# Patient Record
Sex: Male | Born: 1950 | Race: White | Hispanic: No | Marital: Married | Smoking: Never smoker
Health system: Southern US, Community
[De-identification: ages and names within clinical notes are randomized; demographics above are authoritative.]

## PROBLEM LIST (undated history)

## (undated) DIAGNOSIS — E039 Hypothyroidism, unspecified: Secondary | ICD-10-CM

## (undated) DIAGNOSIS — T7840XA Allergy, unspecified, initial encounter: Secondary | ICD-10-CM

## (undated) HISTORY — PX: OTHER SURGICAL HISTORY: SHX169

## (undated) HISTORY — DX: Hypothyroidism, unspecified: E03.9

## (undated) HISTORY — DX: Allergy, unspecified, initial encounter: T78.40XA

## (undated) HISTORY — PX: TONSILLECTOMY AND ADENOIDECTOMY: SUR1326

## (undated) HISTORY — PX: KNEE SURGERY: SHX244

---

## 1998-07-28 ENCOUNTER — Ambulatory Visit (HOSPITAL_BASED_OUTPATIENT_CLINIC_OR_DEPARTMENT_OTHER): Admission: RE | Admit: 1998-07-28 | Discharge: 1998-07-28 | Payer: Self-pay | Admitting: *Deleted

## 2001-07-24 ENCOUNTER — Ambulatory Visit (HOSPITAL_COMMUNITY): Admission: RE | Admit: 2001-07-24 | Discharge: 2001-07-24 | Payer: Self-pay | Admitting: Gastroenterology

## 2009-09-06 ENCOUNTER — Emergency Department: Payer: Self-pay | Admitting: Emergency Medicine

## 2011-11-26 ENCOUNTER — Telehealth: Payer: Self-pay

## 2011-11-26 DIAGNOSIS — R7989 Other specified abnormal findings of blood chemistry: Secondary | ICD-10-CM

## 2011-11-26 NOTE — Telephone Encounter (Signed)
Pt would like to know when he is due for another colonoscopy.  Please call pt.  [phone]

## 2011-11-27 NOTE — Telephone Encounter (Signed)
Per [facility] note. Due for next colonoscopy in five years from 07/29/2006, so he is over due.  Ethan Lee

## 2011-11-27 NOTE — Telephone Encounter (Signed)
Chart pulled nurses station

## 2011-11-29 ENCOUNTER — Telehealth: Payer: Self-pay

## 2011-11-29 NOTE — Telephone Encounter (Signed)
Gave pt information about his colonoscopy and phone # to [facility]. He will call to schedule colonoscopy. Dr Perrin Maltese, pt stated you wanted him to have blood drawn for TSH around this time to re-check level and thinks you wanted lab only, not an OV. Can you put in an order for labs you want done, or do you also need to see pt?

## 2011-11-29 NOTE — Telephone Encounter (Signed)
DR MARTINS OFFICE CALLING TO GET PATIENTS LAST PE FAXED FOR AN UPCOMING APPOINTMENT  FAX IS [phone]

## 2011-12-04 NOTE — Telephone Encounter (Signed)
Need his chart, please

## 2011-12-06 NOTE — Telephone Encounter (Signed)
Call and tell pt. To return for repeat TSH anytime.  Order is placed.

## 2011-12-06 NOTE — Telephone Encounter (Signed)
Told pt that order is in for him to RTC for lab only. Pt agreed

## 2011-12-17 ENCOUNTER — Other Ambulatory Visit (INDEPENDENT_AMBULATORY_CARE_PROVIDER_SITE_OTHER): Payer: 59 | Admitting: Radiology

## 2011-12-17 DIAGNOSIS — R7989 Other specified abnormal findings of blood chemistry: Secondary | ICD-10-CM

## 2011-12-17 DIAGNOSIS — Z1329 Encounter for screening for other suspected endocrine disorder: Secondary | ICD-10-CM

## 2012-01-03 LAB — HM COLONOSCOPY

## 2012-04-06 ENCOUNTER — Ambulatory Visit (INDEPENDENT_AMBULATORY_CARE_PROVIDER_SITE_OTHER): Payer: 59 | Admitting: Physician Assistant

## 2012-04-06 VITALS — BP 120/86 | HR 62 | Temp 98.0°F | Resp 16 | Ht 74.5 in | Wt 197.4 lb

## 2012-04-06 DIAGNOSIS — S61209A Unspecified open wound of unspecified finger without damage to nail, initial encounter: Secondary | ICD-10-CM

## 2012-04-06 NOTE — Progress Notes (Signed)
  Subjective:    Patient ID: Ethan Lee, male    DOB: 16-Dec-1950, 61 y.o.   MRN: [id number]  HPI 61 yr old CM presents with a laceration on his left ring finger.  He cut it on a metal fan blade. Tdap was 04/2011.      Review of Systems  All other systems reviewed and are negative.       Objective:   Physical Exam  Nursing note and vitals reviewed. Constitutional: He appears well-developed and well-nourished.  Skin:         2% plain lidocaine injected locally 1.5cc.  Sterile P&D.  No deep structure.  Appx with 4.0 prolene.  #5 s.i. Placed. bandaged    Assessment & Plan:  Wound finger-SRX7days Wound care h/o given

## 2012-04-15 ENCOUNTER — Ambulatory Visit (INDEPENDENT_AMBULATORY_CARE_PROVIDER_SITE_OTHER): Payer: 59 | Admitting: Physician Assistant

## 2012-04-15 VITALS — BP 110/78 | HR 63 | Temp 97.6°F | Resp 16 | Ht 74.5 in | Wt 197.0 lb

## 2012-04-15 DIAGNOSIS — Z4802 Encounter for removal of sutures: Secondary | ICD-10-CM

## 2012-04-15 NOTE — Progress Notes (Signed)
   Patient ID: Ethan Lee MRN: [id number], DOB: 1951/03/29 61 y.o. Date of Encounter: 04/15/2012, 9:46 AM  Primary Physician: No primary provider on file.  Chief Complaint: Suture removal    See note from 04/06/12    HPI: 61 y.o. y/o male with injury to left ring finger. Here for suture removal s/p placement on 04/06/12 Doing well No issues/complaints Afebrile/ No chills No erythema No pain Able to move without difficulty Normal sensation  No past medical history on file.   Home Meds: Prior to Admission medications   Medication Sig Start Date End Date Taking? Authorizing Provider  cetirizine (ZYRTEC) 10 MG tablet Take 10 mg by mouth every evening.   Yes Historical Provider, MD  loratadine (CLARITIN) 10 MG tablet Take 10 mg by mouth every morning.   Yes Historical Provider, MD    Allergies: No Known Allergies  Physical Exam: Blood pressure 110/78, pulse 63, temperature 97.6 F (36.4 C), resp. rate 16, height 6' 2.5" (1.892 m), weight 197 lb (89.359 kg)., Body mass index is 24.96 kg/(m^2). General: Well developed, well nourished, in no acute distress. Head: Normocephalic, atraumatic, sclera non-icteric, no xanthomas, nares are without discharge.  Neck: Supple. Lungs: Breathing is unlabored. Heart: Normal rate. Msk:  Strength and tone appear normal for 61. Wound: Wound well healed without erythema, swelling, or tenderness to palpation. FROM and 5/5 strength with normal sensation throughout including 2 point discrimination Skin: See above, otherwise dry without rash or erythema. Extremities: No clubbing or cyanosis. No edema. Neuro: Alert and oriented X 3. Moves all extremities spontaneously.  Psych:  Responds to questions appropriately with a normal affect.   PROCEDURE: Verbal consent obtained. 5 sutures removed without difficulty.  Assessment and Plan: 61 y.o. y/o male here for suture removal for wound described above. -Sutures removed per above -Wound  resolved -RTC prn  Signed, Eula Listen, PA-C 04/15/2012 9:46 AM

## 2012-06-30 ENCOUNTER — Ambulatory Visit (INDEPENDENT_AMBULATORY_CARE_PROVIDER_SITE_OTHER): Payer: 59 | Admitting: Internal Medicine

## 2012-06-30 ENCOUNTER — Encounter: Payer: Self-pay | Admitting: Internal Medicine

## 2012-06-30 VITALS — BP 118/77 | HR 69 | Temp 98.1°F | Resp 16 | Ht 74.0 in | Wt 194.0 lb

## 2012-06-30 DIAGNOSIS — Z91010 Allergy to peanuts: Secondary | ICD-10-CM

## 2012-06-30 DIAGNOSIS — Z789 Other specified health status: Secondary | ICD-10-CM

## 2012-06-30 DIAGNOSIS — J301 Allergic rhinitis due to pollen: Secondary | ICD-10-CM

## 2012-06-30 DIAGNOSIS — R011 Cardiac murmur, unspecified: Secondary | ICD-10-CM

## 2012-06-30 DIAGNOSIS — L853 Xerosis cutis: Secondary | ICD-10-CM

## 2012-06-30 DIAGNOSIS — R012 Other cardiac sounds: Secondary | ICD-10-CM

## 2012-06-30 DIAGNOSIS — Z0000 Encounter for general adult medical examination without abnormal findings: Secondary | ICD-10-CM

## 2012-06-30 LAB — POCT UA - MICROSCOPIC ONLY
Casts, Ur, LPF, POC: NEGATIVE
Yeast, UA: NEGATIVE

## 2012-06-30 LAB — POCT URINALYSIS DIPSTICK
Blood, UA: NEGATIVE
Ketones, UA: NEGATIVE
Protein, UA: NEGATIVE
Spec Grav, UA: 1.015
Urobilinogen, UA: 0.2
pH, UA: 5.5

## 2012-06-30 LAB — IFOBT (OCCULT BLOOD): IFOBT: NEGATIVE

## 2012-06-30 MED ORDER — TRIAMCINOLONE ACETONIDE 0.1 % EX CREA: TOPICAL_CREAM | Freq: Two times a day (BID) | CUTANEOUS | Status: DC

## 2012-06-30 NOTE — Progress Notes (Signed)
  Subjective:    Patient ID: Ethan Lee, male    DOB: 1950/10/05, 61 y.o.   MRN: [id number]  HPI Feels good Has occ neck pain, loss of rom, no nms loss Allergys worst problem and seeing new allergist soon See scanned hx  Review of Systems See scanned ros    Objective:   Physical Exam  Vitals reviewed. Constitutional: He is oriented to person, place, and time. He appears well-developed and well-nourished.  HENT:  Right Ear: External ear normal.  Left Ear: External ear normal.  Nose: Nose normal.  Mouth/Throat: Oropharynx is clear and moist.  Eyes: EOM are normal. Pupils are equal, round, and reactive to light.  Neck: Normal range of motion. Neck supple. No tracheal deviation present. No thyromegaly present.  Cardiovascular: Normal rate, regular rhythm, S1 normal, S2 normal, intact distal pulses and normal pulses.   No extrasystoles are present. PMI is not displaced.        Has mid systolic click  Pulmonary/Chest: Effort normal and breath sounds normal.  Abdominal: Soft. Bowel sounds are normal. He exhibits no distension and no mass. There is no tenderness.  Genitourinary: Rectum normal, prostate normal and penis normal.  Musculoskeletal: Normal range of motion. He exhibits tenderness.  Lymphadenopathy:    He has no cervical adenopathy.  Neurological: He is alert and oriented to person, place, and time. He has normal reflexes. No cranial nerve deficit. He exhibits normal muscle tone. Coordination normal.  Skin: Skin is warm and dry.  Psychiatric: He has a normal mood and affect. His behavior is normal. Judgment and thought content normal.    ekg nl      Assessment & Plan:  RF to cardiology to evaluate mid systolic click Otherwise healthy exam

## 2012-06-30 NOTE — Patient Instructions (Addendum)
Food Allergy A food allergy occurs from eating something you are sensitive to. Food allergies occur in all age groups. It may be passed to you from your parents (heredity).  CAUSES  Some common causes are cow's milk, seafood, eggs, nuts (including peanut butter), wheat, and soybeans. SYMPTOMS  Common problems are:   Swelling around the mouth.   An itchy, red rash.   Hives.   Vomiting.   Diarrhea.  Severe allergic reactions are life-threatening. This reaction is called anaphylaxis. It can cause the mouth and throat to swell. This makes it hard to breathe and swallow. In severe reactions, only a small amount of food may be fatal within seconds. HOME CARE INSTRUCTIONS   If you are unsure what caused the reaction, keep a diary of foods eaten and symptoms that followed. Avoid foods that cause reactions.   If hives or rash are present:   Take medicines as directed.   Use an over-the-counter antihistamine (diphenhydramine) to treat hives and itching as needed.   Apply cold compresses to the skin or take baths in cool water. Avoid hot baths or showers. These will increase the redness and itching.   If you are severely allergic:   Hospitalization is often required following a severe reaction.   Wear a medical alert bracelet or necklace that describes the allergy.   Carry your anaphylaxis kit or epinephrine injection with you at all times. Both you and your family members should know how to use this. This can be lifesaving if you have a severe reaction. If epinephrine is used, it is important for you to seek immediate medical care or call your local emergency services (911 in [place] When the epinephrine wears off, it can be followed by a delayed reaction, which can be fatal.   Replace your epinephrine immediately after use in case of another reaction.   Ask your caregiver for instructions if you have not been taught how to use an epinephrine injection.   Do not drive until medicines  used to treat the reaction have worn off, unless approved by your caregiver.  SEEK MEDICAL CARE IF:   You suspect a food allergy. Symptoms generally happen within 30 minutes of eating a food.   Your symptoms have not gone away within 2 days. See your caregiver sooner if symptoms are getting worse.   You develop new symptoms.   You want to retest yourself with a food or drink you think causes an allergic reaction. Never do this if an anaphylactic reaction to that food or drink has happened before.   There is a return of the symptoms which brought you to your caregiver.  SEEK IMMEDIATE MEDICAL CARE IF:   You have trouble breathing, are wheezing, or you have a tight feeling in your chest or throat.   You have a swollen mouth, or you have hives, swelling, or itching all over your body. Use your epinephrine injection immediately. This is given into the outside of your thigh, deep into the muscle. Following use of the epinephrine injection, seek help right away.  Seek immediate medical care or call your local emergency services (911 in [place] MAKE SURE YOU:   Understand these instructions.   Will watch your condition.   Will get help right away if you are not doing well or get worse.  Document Released: 09/14/2000 Document Revised: 09/06/2011 Document Reviewed: 05/06/2008 [facility] Patient Information 2012 [place], [place].

## 2012-07-15 ENCOUNTER — Ambulatory Visit (INDEPENDENT_AMBULATORY_CARE_PROVIDER_SITE_OTHER): Payer: Self-pay | Admitting: Cardiology

## 2012-07-15 ENCOUNTER — Encounter: Payer: Self-pay | Admitting: Cardiology

## 2012-07-15 VITALS — BP 131/78 | HR 67 | Ht 74.0 in | Wt 197.0 lb

## 2012-07-15 DIAGNOSIS — R012 Other cardiac sounds: Secondary | ICD-10-CM

## 2012-07-15 DIAGNOSIS — I341 Nonrheumatic mitral (valve) prolapse: Secondary | ICD-10-CM

## 2012-07-15 DIAGNOSIS — I059 Rheumatic mitral valve disease, unspecified: Secondary | ICD-10-CM

## 2012-07-15 NOTE — Progress Notes (Signed)
HPI  The patient presents for evaluation of a mitral valve click.  He was seen by Dr. Perrin Maltese and he was heard to have a midsystolic click. The patient has had no past cardiac history. He is active walking his dog and pushing his lawnmower. With this he denies any chest pressure, neck or arm discomfort. He doesn't notice any palpitations, presyncope or syncope. He has no PND or orthopnea. He's had no weight gain or edema. He has had some mild fatigue recently.   Allergies  Allergen Reactions  . Peanuts (Peanut Oil)   . Shellfish Allergy     Current Outpatient Prescriptions  Medication Sig Dispense Refill  . aspirin 81 MG tablet Take 81 mg by mouth daily.      . cetirizine (ZYRTEC) 10 MG tablet Take 10 mg by mouth every evening.      . cholecalciferol (VITAMIN D) 1000 UNITS tablet Take 1,000 Units by mouth daily.      Marland Kitchen loratadine (CLARITIN) 10 MG tablet Take 10 mg by mouth every morning.      . triamcinolone cream (KENALOG) 0.1 % Apply topically 2 (two) times daily. Apply bid to chronic rash prn need Do not use on face or genitals  85.2 g  3  . vitamin C (ASCORBIC ACID) 500 MG tablet Take 500 mg by mouth daily. Two tabs      . vitamin E 400 UNIT capsule Take 400 Units by mouth daily.        Past Medical History  Diagnosis Date  . Allergy     Past Surgical History  Procedure Date  . Knee surgery     x 4  . Vocal cord polyps   . Tonsillectomy and adenoidectomy     No family history on file.  History   Social History  . Marital Status: Married    Spouse Name: N/A    Number of Children: 2  . Years of Education: N/A   Occupational History  .     Social History Main Topics  . Smoking status: Never Smoker   . Smokeless tobacco: Not on file  . Alcohol Use: Not on file  . Drug Use: Not on file  . Sexually Active: Not on file   Other Topics Concern  . Not on file   Social History Narrative   Lives with wife and works at [employer].    ROS:  As stated in the HPI and  negative for all other systems.  PHYSICAL EXAM BP 131/78  Pulse 67  Ht 6\' 2"  (1.88 m)  Wt 197 lb (89.359 kg)  BMI 25.29 kg/m2 GENERAL:  Well appearing HEENT:  Pupils equal round and reactive, fundi not visualized, oral mucosa unremarkable NECK:  No jugular venous distention, waveform within normal limits, carotid upstroke brisk and symmetric, no bruits, no thyromegaly LYMPHATICS:  No cervical, inguinal adenopathy LUNGS:  Clear to auscultation bilaterally BACK:  No CVA tenderness CHEST:  Unremarkable HEART:  PMI not displaced or sustained,S1 and S2 within normal limits, no S3, no S4, mid systolic click, no rubs, no murmurs ABD:  Flat, positive bowel sounds normal in frequency in pitch, no bruits, no rebound, no guarding, no midline pulsatile mass, no hepatomegaly, no splenomegaly EXT:  2 plus pulses throughout, no edema, no cyanosis no clubbing SKIN:  No rashes no nodules NEURO:  Cranial nerves II through XII grossly intact, motor grossly intact throughout PSYCH:  Cognitively intact, oriented to person place and time   EKG: Sinus  rhythm, rate 56, axis within normal limits, intervals within normal limits, no acute ST-T wave changes. 07/15/2012   ASSESSMENT AND PLAN  MVP - The patient has a click consistent with mitral valve prolapse but I do not hear a regurgitant murmur. At this point I will order an echocardiogram but I do not suspect that further cardiovascular treatment or testing beyond this will be needed at this time. We discussed the physiology using a heart model. I will see him back based on the results of this echocardiogram.

## 2012-07-15 NOTE — Patient Instructions (Addendum)
Continue current medications as listed.  Your physician has requested that you have an echocardiogram. Echocardiography is a painless test that uses sound waves to create images of your heart. It provides your doctor with information about the size and shape of your heart and how well your heart's chambers and valves are working. This procedure takes approximately one hour. There are no restrictions for this procedure.  Follow up will be based on these results.  Mitral Valve Prolapse The mitral valve is located between the top and bottom parts of the heart on the left side. A mitral valve prolapse (MVP) is an abnormal bulging of 1 or both of the 2 mitral leaflets. The valve bulges into the top chamber (atrium) of the heart when the bottom chamber (ventricle) squeezes or contracts. MVP is more common in females. It is an inherited problem and is usually not found until adolescence. It is not harmful and rarely needs other treatment. PROBLEMS MAY INCLUDE:  Chest pain.  Palpitations.  Anxiety.  Panic attacks.  Stroke, rarely. HOME CARE INSTRUCTIONS   Taking antibiotics before a dental or other medical procedure is no longer routine. Consult with your caregiver.  Exercise as your caregiver instructs.  Discuss cardiac risk factors associated with MVP with your caregiver. SEEK IMMEDIATE MEDICAL CARE IF:   You develop frequent episodes of chest pain or an irregular heartbeat.  You faint or pass out.  You have severe chest pain or shortness of breath.  You develop palpitations with weakness or dizziness.  You have difficulty with vision or swallowing or weakness or numbness on one side of your body. MAKE SURE YOU:   Understand these instructions.  Will watch your condition.  Will get help right away if you are not doing well or get worse. Document Released: 09/14/2000 Document Revised: 12/10/2011 Document Reviewed: 11/14/2007 [facility] Patient Information 2013 [place],  [place].

## 2012-07-23 ENCOUNTER — Ambulatory Visit (HOSPITAL_COMMUNITY): Payer: 59 | Attending: Cardiovascular Disease

## 2012-07-23 DIAGNOSIS — I341 Nonrheumatic mitral (valve) prolapse: Secondary | ICD-10-CM

## 2012-07-23 DIAGNOSIS — I369 Nonrheumatic tricuspid valve disorder, unspecified: Secondary | ICD-10-CM | POA: Insufficient documentation

## 2012-07-23 DIAGNOSIS — I080 Rheumatic disorders of both mitral and aortic valves: Secondary | ICD-10-CM | POA: Insufficient documentation

## 2012-07-23 DIAGNOSIS — R011 Cardiac murmur, unspecified: Secondary | ICD-10-CM | POA: Insufficient documentation

## 2012-07-23 DIAGNOSIS — I379 Nonrheumatic pulmonary valve disorder, unspecified: Secondary | ICD-10-CM | POA: Insufficient documentation

## 2012-07-23 NOTE — Progress Notes (Signed)
Echocardiogram performed.  

## 2012-07-25 ENCOUNTER — Telehealth: Payer: Self-pay | Admitting: Radiology

## 2012-07-25 NOTE — Telephone Encounter (Signed)
Spoke with pt and gave results - pt understood and will await copy in mail.   WGN:FAOZ in your box.

## 2012-11-15 ENCOUNTER — Other Ambulatory Visit: Payer: Self-pay

## 2013-01-05 ENCOUNTER — Encounter: Payer: Self-pay | Admitting: Internal Medicine

## 2013-05-01 DIAGNOSIS — Z0271 Encounter for disability determination: Secondary | ICD-10-CM

## 2013-05-21 ENCOUNTER — Telehealth: Payer: Self-pay | Admitting: Cardiology

## 2013-05-21 NOTE — Telephone Encounter (Signed)
New problem    Pt has question concerning the results of his Echocardiogram that was done on 07/23/12. Please call pt concerning this because it is causing him to get decline from insurance.

## 2013-05-22 NOTE — Telephone Encounter (Signed)
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  He would like to know if Dr Antoine Poche can write a letter to the insurance company for him stating that he should be able to obtain insurance despite this.  [address], [place] phone number.  Aware I will forward information to Dr Antoine Poche for review

## 2013-05-26 NOTE — Telephone Encounter (Signed)
Can we put together a note that states that his mild aortic dilatation is not clinically significant and needs no further work up and should not keep him from getting this insurance.  Call Mr. Rames with the results and send results to GUEST, Loretha Stapler, MD

## 2013-05-27 NOTE — Telephone Encounter (Signed)
New problem   Pt is calling in reference to problem he was having last week. Please call pt.

## 2013-05-27 NOTE — Telephone Encounter (Signed)
**Note De-Identified Ethan Lee Obfuscation** Pt states that his insurance company needs a reply by Friday 8/29. Please call pt if this is not possible.

## 2013-05-28 ENCOUNTER — Encounter: Payer: Self-pay | Admitting: *Deleted

## 2013-05-28 NOTE — Telephone Encounter (Signed)
Letter completed and faxed to [phone].  Pt aware.  Copy faxed to him as well at [phone]

## 2013-07-06 ENCOUNTER — Ambulatory Visit (INDEPENDENT_AMBULATORY_CARE_PROVIDER_SITE_OTHER): Payer: 59 | Admitting: Internal Medicine

## 2013-07-06 ENCOUNTER — Encounter: Payer: Self-pay | Admitting: Internal Medicine

## 2013-07-06 ENCOUNTER — Other Ambulatory Visit: Payer: Self-pay | Admitting: Internal Medicine

## 2013-07-06 VITALS — BP 118/78 | HR 59 | Temp 98.1°F | Resp 16 | Ht 73.25 in | Wt 197.0 lb

## 2013-07-06 DIAGNOSIS — B351 Tinea unguium: Secondary | ICD-10-CM

## 2013-07-06 DIAGNOSIS — I341 Nonrheumatic mitral (valve) prolapse: Secondary | ICD-10-CM

## 2013-07-06 DIAGNOSIS — R946 Abnormal results of thyroid function studies: Secondary | ICD-10-CM

## 2013-07-06 DIAGNOSIS — Z800 Family history of malignant neoplasm of digestive organs: Secondary | ICD-10-CM

## 2013-07-06 DIAGNOSIS — R319 Hematuria, unspecified: Secondary | ICD-10-CM

## 2013-07-06 DIAGNOSIS — Z0000 Encounter for general adult medical examination without abnormal findings: Secondary | ICD-10-CM

## 2013-07-06 DIAGNOSIS — Z23 Encounter for immunization: Secondary | ICD-10-CM

## 2013-07-06 DIAGNOSIS — I059 Rheumatic mitral valve disease, unspecified: Secondary | ICD-10-CM

## 2013-07-06 DIAGNOSIS — R7989 Other specified abnormal findings of blood chemistry: Secondary | ICD-10-CM

## 2013-07-06 DIAGNOSIS — N390 Urinary tract infection, site not specified: Secondary | ICD-10-CM

## 2013-07-06 LAB — POCT UA - MICROSCOPIC ONLY
Casts, Ur, LPF, POC: NEGATIVE
Yeast, UA: NEGATIVE

## 2013-07-06 LAB — POCT URINALYSIS DIPSTICK
Leukocytes, UA: NEGATIVE
Protein, UA: NEGATIVE
Spec Grav, UA: 1.01
Urobilinogen, UA: 0.2
pH, UA: 5.5

## 2013-07-06 MED ORDER — TERBINAFINE HCL 250 MG PO TABS: 250.0000 mg | ORAL_TABLET | Freq: Every day | ORAL | Status: DC

## 2013-07-06 NOTE — Patient Instructions (Addendum)
Allergic Rhinitis Allergic rhinitis is when the mucous membranes in the nose respond to allergens. Allergens are particles in the air that cause your body to have an allergic reaction. This causes you to release allergic antibodies. Through a chain of events, these eventually cause you to release histamine into the blood stream (hence the use of antihistamines). Although meant to be protective to the body, it is this release that causes your discomfort, such as frequent sneezing, congestion and an itchy runny nose.  CAUSES  The pollen allergens may come from grasses, trees, and weeds. This is seasonal allergic rhinitis, or "hay fever." Other allergens cause year-round allergic rhinitis (perennial allergic rhinitis) such as house dust mite allergen, pet dander and mold spores.  SYMPTOMS   Nasal stuffiness (congestion).  Runny, itchy nose with sneezing and tearing of the eyes.  There is often an itching of the mouth, eyes and ears. It cannot be cured, but it can be controlled with medications. DIAGNOSIS  If you are unable to determine the offending allergen, skin or blood testing may find it. TREATMENT   Avoid the allergen.  Medications and allergy shots (immunotherapy) can help.  Hay fever may often be treated with antihistamines in pill or nasal spray forms. Antihistamines block the effects of histamine. There are over-the-counter medicines that may help with nasal congestion and swelling around the eyes. Check with your caregiver before taking or giving this medicine. If the treatment above does not work, there are many new medications your caregiver can prescribe. Stronger medications may be used if initial measures are ineffective. Desensitizing injections can be used if medications and avoidance fails. Desensitization is when a patient is given ongoing shots until the body becomes less sensitive to the allergen. Make sure you follow up with your caregiver if problems continue. SEEK MEDICAL  CARE IF:   You develop fever (more than 100.5 F (38.1 C).  You develop a cough that does not stop easily (persistent).  You have shortness of breath.  You start wheezing.  Symptoms interfere with normal daily activities. Document Released: 06/12/2001 Document Revised: 12/10/2011 Document Reviewed: 12/22/2008 [facility] Patient Information 2014 [place], [place]. Ringworm, Nail A fungal infection of the nail (tinea unguium/onychomycosis) is common. It is common as the visible part of the nail is composed of dead cells which have no blood supply to help prevent infection. It occurs because fungi are everywhere and will pick any opportunity to grow on any dead material. Because nails are very slow growing they require up to 2 years of treatment with anti-fungal medications. The entire nail back to the base is infected. This includes approximately  of the nail which you cannot see. If your caregiver has prescribed a medication by mouth, take it every day and as directed. No progress will be seen for at least 6 to 9 months. Do not be disappointed! Because fungi live on dead cells with little or no exposure to blood supply, medication delivery to the infection is slow; thus the cure is slow. It is also why you can observe no progress in the first 6 months. The nail becoming cured is the base of the nail, as it has the blood supply. Topical medication such as creams and ointments are usually not effective. Important in successful treatment of nail fungus is closely following the medication regimen that your doctor prescribes. Sometimes you and your caregiver may elect to speed up this process by surgical removal of all the nails. Even this may still require 6 to  9 months of additional oral medications. See your caregiver as directed. Remember there will be no visible improvement for at least 6 months. See your caregiver sooner if other signs of infection (redness and swelling) develop. Document Released:  09/14/2000 Document Revised: 12/10/2011 Document Reviewed: 11/23/2008 [facility] Patient Information 2014 [place], [place].

## 2013-07-06 NOTE — Progress Notes (Signed)
  Subjective:    Patient ID: Ethan Lee, male    DOB: 12/09/1950, 62 y.o.   MRN: [id number]  HPI Doing well, allergys controlled, MVP stable. Hx elevated TSH, needs repeating.   Review of Systems  Constitutional: Negative.   HENT: Positive for neck pain and neck stiffness.   Eyes: Negative.   Respiratory: Negative.   Cardiovascular: Negative.   Gastrointestinal: Negative.   Endocrine: Negative.   Genitourinary: Negative.   Musculoskeletal: Positive for arthralgias.  Skin: Negative.   Allergic/Immunologic: Negative.   Neurological: Negative.   Hematological: Negative.   Psychiatric/Behavioral: Negative.        Objective:   Physical Exam  Constitutional: He is oriented to person, place, and time. He appears well-developed.  HENT:  Head: Normocephalic.  Right Ear: External ear normal.  Left Ear: External ear normal.  Nose: Nose normal.  Mouth/Throat: Oropharynx is clear and moist.  Eyes: Conjunctivae and EOM are normal. Pupils are equal, round, and reactive to light.  Neck: Normal range of motion. Neck supple. No tracheal deviation present. No thyromegaly present.  Cardiovascular: Normal rate, regular rhythm, normal heart sounds and intact distal pulses.   Pulmonary/Chest: Effort normal and breath sounds normal.  Abdominal: Soft. Bowel sounds are normal.  Genitourinary: Rectum normal, prostate normal and penis normal.  Musculoskeletal: Normal range of motion.  Lymphadenopathy:    He has no cervical adenopathy.  Neurological: He is alert and oriented to person, place, and time. He has normal reflexes. He displays normal reflexes. No cranial nerve deficit. He exhibits normal muscle tone. Coordination normal.  Skin: Skin is warm and dry.  Psychiatric: He has a normal mood and affect. His behavior is normal. Judgment and thought content normal.   Results for orders placed in visit on 06/30/12  POCT URINALYSIS DIPSTICK      Result Value Range   Color, UA yellow     Clarity, UA clear     Glucose, UA neg     Bilirubin, UA neg     Ketones, UA neg     Spec Grav, UA 1.015     Blood, UA neg     pH, UA 5.5     Protein, UA neg     Urobilinogen, UA 0.2     Nitrite, UA neg     Leukocytes, UA Negative    POCT UA - MICROSCOPIC ONLY      Result Value Range   WBC, Ur, HPF, POC neg     RBC, urine, microscopic neg     Bacteria, U Microscopic neg     Mucus, UA trace     Epithelial cells, urine per micros 0-1     Crystals, Ur, HPF, POC neg     Casts, Ur, LPF, POC neg     Yeast, UA neg    IFOBT (OCCULT BLOOD)      Result Value Range   IFOBT Negative      Culture toenail growth      Assessment & Plan:  Healthy CPE. Onychomycosis/Tinea pedis right foot only Lamisil 250mg  qd 3 months/SED?then off 2 mos then on 2 more months Check LFTs one month

## 2013-07-06 NOTE — Progress Notes (Signed)
  Subjective:    Patient ID: Ethan Lee, male    DOB: 1950/11/04, 62 y.o.   MRN: [id number]  HPI    Review of Systems  Constitutional: Negative.   HENT: Positive for neck pain.   Eyes: Negative.   Respiratory: Negative.   Cardiovascular: Negative.   Gastrointestinal: Negative.   Endocrine: Negative.   Genitourinary: Negative.   Skin: Negative.   Allergic/Immunologic: Negative.   Neurological: Negative.   Hematological: Negative.   Psychiatric/Behavioral: Negative.        Objective:   Physical Exam        Assessment & Plan:

## 2013-07-07 LAB — COMPREHENSIVE METABOLIC PANEL
AST: 19 IU/L (ref 0–40)
Albumin/Globulin Ratio: 2.1 (ref 1.1–2.5)
Albumin: 4.4 g/dL (ref 3.6–4.8)
Alkaline Phosphatase: 60 IU/L (ref 39–117)
BUN/Creatinine Ratio: 17 (ref 10–22)
BUN: 17 mg/dL (ref 8–27)
CO2: 26 mmol/L (ref 18–29)
Creatinine, Ser: 1.01 mg/dL (ref 0.76–1.27)
GFR calc Af Amer: 92 mL/min/{1.73_m2} (ref >59)
GFR calc non Af Amer: 79 mL/min/{1.73_m2} (ref >59)
Globulin, Total: 2.1 g/dL (ref 1.5–4.5)
Total Bilirubin: 0.7 mg/dL (ref 0.0–1.2)
Total Protein: 6.5 g/dL (ref 6.0–8.5)

## 2013-07-07 LAB — LIPID PANEL
Chol/HDL Ratio: 3.5 ratio units (ref 0.0–5.0)
Cholesterol, Total: 159 mg/dL (ref 100–199)
HDL: 46 mg/dL (ref >39)
LDL Calculated: 101 mg/dL — ABNORMAL HIGH (ref 0–99)
Triglycerides: 62 mg/dL (ref 0–149)
VLDL Cholesterol Cal: 12 mg/dL (ref 5–40)

## 2013-07-07 LAB — CBC WITH DIFFERENTIAL/PLATELET
Basophils Absolute: 0 10*3/uL (ref 0.0–0.2)
Eos: 5 %
HCT: 38.5 % (ref 37.5–51.0)
Immature Grans (Abs): 0 10*3/uL (ref 0.0–0.1)
Immature Granulocytes: 0 %
Lymphocytes Absolute: 0.9 10*3/uL (ref 0.7–3.1)
Lymphs: 27 %
MCHC: 35.6 g/dL (ref 31.5–35.7)
Monocytes: 11 %
RDW: 13.5 % (ref 12.3–15.4)
WBC: 3.4 10*3/uL (ref 3.4–10.8)

## 2013-07-07 LAB — TSH

## 2013-07-10 ENCOUNTER — Telehealth: Payer: Self-pay

## 2013-07-10 MED ORDER — SULFAMETHOXAZOLE-TMP DS 800-160 MG PO TABS: 1.0000 | ORAL_TABLET | Freq: Two times a day (BID) | ORAL | Status: DC

## 2013-07-10 NOTE — Telephone Encounter (Signed)
Dr. Perrin Maltese, disregard lab result comments. Spoke with Chelle because pt is going out of town tomorrow. Sent in Septra DS for pt.

## 2013-07-17 MED ORDER — CIPROFLOXACIN HCL 500 MG PO TABS: 500.0000 mg | ORAL_TABLET | Freq: Two times a day (BID) | ORAL | Status: DC

## 2013-07-17 NOTE — Addendum Note (Signed)
Addended by: Jonita Albee on: 07/17/2013 06:36 PM   Modules accepted: Orders

## 2013-07-18 NOTE — Addendum Note (Signed)
Addended by: Johnnette Litter on: 07/18/2013 09:15 AM   Modules accepted: Orders, Medications

## 2013-07-30 LAB — FUNGUS CULTURE, BLOOD

## 2013-08-06 ENCOUNTER — Other Ambulatory Visit: Payer: Self-pay

## 2013-08-25 ENCOUNTER — Encounter: Payer: Self-pay | Admitting: Internal Medicine

## 2013-09-02 ENCOUNTER — Telehealth: Payer: Self-pay

## 2013-09-02 NOTE — Telephone Encounter (Signed)
Patient is wondering whether he needs to come back in to do lab work? He says he took an antibacterial for UTI last visit and Dr. Perrin Maltese mentioned coming back in to check liver levels.

## 2013-09-03 NOTE — Telephone Encounter (Signed)
Dr. Guest, please advise 

## 2013-09-06 NOTE — Telephone Encounter (Signed)
rtc for lab work

## 2013-09-07 NOTE — Telephone Encounter (Signed)
Called to advise, left detailed message.

## 2013-09-08 ENCOUNTER — Ambulatory Visit (INDEPENDENT_AMBULATORY_CARE_PROVIDER_SITE_OTHER): Payer: 59 | Admitting: Internal Medicine

## 2013-09-08 VITALS — BP 115/82 | HR 70 | Temp 98.2°F | Resp 18 | Wt 199.0 lb

## 2013-09-08 DIAGNOSIS — Z79899 Other long term (current) drug therapy: Secondary | ICD-10-CM

## 2013-09-08 DIAGNOSIS — B351 Tinea unguium: Secondary | ICD-10-CM

## 2013-09-08 DIAGNOSIS — R7989 Other specified abnormal findings of blood chemistry: Secondary | ICD-10-CM

## 2013-09-08 DIAGNOSIS — R946 Abnormal results of thyroid function studies: Secondary | ICD-10-CM

## 2013-09-08 NOTE — Progress Notes (Signed)
   Subjective:    Patient ID: Ethan Lee, male    DOB: [birth year], 62 y.o.   MRN: [id number]  HPI Patient presents today for a follow-up on medication he was taking. He had a foot fungus and was on medication for a long period of time and he is wanting to make sure his liver function is not elevated.   Also has elevated TSH, needs f/up test  Review of Systems     Objective:   Physical Exam  Vitals reviewed. Constitutional: He is oriented to person, place, and time. He appears well-developed and well-nourished.  HENT:  Head: Normocephalic.  Eyes: EOM are normal. No scleral icterus.  Neck: Normal range of motion.  Pulmonary/Chest: Effort normal.  Musculoskeletal: Normal range of motion.  Neurological: He is alert and oriented to person, place, and time. He exhibits normal muscle tone. Coordination normal.          Assessment & Plan:  Liver profile/TSH Continue lamisil oral

## 2013-09-08 NOTE — Progress Notes (Signed)
   Subjective:    Patient ID: Ethan Lee, male    DOB: 1951/01/15, 62 y.o.   MRN: [id number]  HPI    Review of Systems     Objective:   Physical Exam        Assessment & Plan:

## 2013-09-09 LAB — HEPATIC FUNCTION PANEL
Albumin: 4.7 g/dL (ref 3.6–4.8)
Alkaline Phosphatase: 60 IU/L (ref 39–117)
Total Protein: 7.2 g/dL (ref 6.0–8.5)

## 2013-11-27 ENCOUNTER — Telehealth: Payer: Self-pay

## 2013-11-27 NOTE — Telephone Encounter (Signed)
Dr. Elder Cyphers   Patient is requesting an order for bloodwork.  He plans on rtc for an ov with you in June.  But wants to have his levels evaluated  [phone]

## 2014-03-30 ENCOUNTER — Telehealth: Payer: Self-pay

## 2014-03-30 NOTE — Telephone Encounter (Signed)
Pt.notified

## 2014-03-30 NOTE — Telephone Encounter (Signed)
PATIENT WAS PRESCRIBED MEDICATION FOR HIS TOENAIL INFECTION AND HE HAD SOME QUESTIONS ABOUT HOW TO TAKE IT.

## 2014-03-30 NOTE — Telephone Encounter (Signed)
Called and spoke with pt regarding his Terbinafine. He was originally prescribed it 07/06/13; was told to take it for 3 months 2 off then 2 on. He came for a follow up visit on 09/08/13 to have his LFT's checked.  He said he is out of refills and was requesting another refill.  I told him he may have to RTC to receive further refills. Should he come back in to have his LFT's checked again? He just finished taking 2 months of the medication.

## 2014-03-30 NOTE — Telephone Encounter (Signed)
So it sounds like he completed the course as Dr. Elder Cyphers directed, right?  Pt has completed 3 months, then off for 2 months, then just completed 2 more months -  Which is what Dr. Luiz Ochoa note from Oct 2014 indicates he should do.  If onychomycosis has not resolved, recommend he RTC for further evaluation

## 2014-04-21 ENCOUNTER — Ambulatory Visit (INDEPENDENT_AMBULATORY_CARE_PROVIDER_SITE_OTHER): Payer: 59 | Admitting: Family Medicine

## 2014-04-21 VITALS — BP 120/80 | HR 58 | Temp 98.1°F | Ht 73.0 in | Wt 201.0 lb

## 2014-04-21 DIAGNOSIS — Z79899 Other long term (current) drug therapy: Secondary | ICD-10-CM

## 2014-04-21 DIAGNOSIS — B351 Tinea unguium: Secondary | ICD-10-CM

## 2014-04-21 NOTE — Patient Instructions (Signed)
Followup with you over the phone concerning lab results and make any further recommendations concerning medication use for your toenail fungus going forward

## 2014-04-21 NOTE — Progress Notes (Signed)
   Subjective:    Patient ID: Ethan Lee, male    DOB: May 22, 1951, 63 y.o.   MRN: [id number]  HPI Patient's a 63 year old Caucasian male presents today to followup on his left great toe onychomycosis for long term treatment.  Patient typically takes medication for a month at a time taking approximately 2 weeks off in between her continuous dosing. Reports improvement in his toenail fungus and like to continue medications. He was told to check on his liver function testing every 6 months to a year to make sure that he does not develop hepatic toxicity from Lamisil. Patient denies any abdominal pain liver tenderness. Denies any headache fever nausea  Review of Systems Negative except for what is present in history of present illness    Objective:   Physical Exam Vitals reviewed.  Constitutional: He is oriented to person, place, and time. He appears well-developed and well-nourished.  HENT:  Head: Normocephalic.  Eyes: EOM are normal. No scleral icterus.  Neck: Normal range of motion.  Pulmonary/Chest: Effort normal.  Musculoskeletal: Normal range of motion.  Ext: Patient's right great toe has some thickening of the nail bed, yellowing consistent with likely a fungal infection  Neurological: He is alert and oriented to person, place, and time. He exhibits normal muscle tone. Coordination normal.     Assessment & Plan:  Should today for management of high-risk medication. Patient Lamisil for toenail fungus. Controlling symptoms for the most part. Patient is in pulse [place]. taking medication for a month at a time with 63 weeks off. He has not taken the medication in over a month. Check LFTs today determined no signs of hepatic toxicity. Followup with patient's labs over the phone. Patient will followup when necessary otherwise.

## 2014-04-22 ENCOUNTER — Encounter: Payer: Self-pay | Admitting: Family Medicine

## 2014-04-22 NOTE — Progress Notes (Signed)
Patient discussed with Dr. Ollen Barges. Agree with assessment and plan of care per her note. In summary - on high risk med - lamisil, but prior LFT's ok.  Tolerating medication without apparent SE's or rxn.  Repeat LFT's.

## 2014-04-23 LAB — HEPATIC FUNCTION PANEL
ALT: 12 IU/L (ref 0–44)
AST: 14 IU/L (ref 0–40)
Albumin: 4.9 g/dL — ABNORMAL HIGH (ref 3.6–4.8)
Alkaline Phosphatase: 64 IU/L (ref 39–117)
BILIRUBIN DIRECT: 0.13 mg/dL (ref 0.00–0.40)
Total Bilirubin: 0.6 mg/dL (ref 0.0–1.2)
Total Protein: 7.2 g/dL (ref 6.0–8.5)

## 2014-04-29 ENCOUNTER — Encounter: Payer: Self-pay | Admitting: Radiology

## 2014-05-06 ENCOUNTER — Telehealth: Payer: Self-pay

## 2014-05-06 DIAGNOSIS — B351 Tinea unguium: Secondary | ICD-10-CM

## 2014-05-06 NOTE — Telephone Encounter (Signed)
Pt would like a refill on terbinafine (LAMISIL) 250 MG tablet [[id number]]

## 2014-05-06 NOTE — Telephone Encounter (Signed)
Please advise dosing directions for Lamisil- lab results state to continue lamisil with pulse dosing.

## 2014-05-07 MED ORDER — TERBINAFINE HCL 250 MG PO TABS: 250.0000 mg | ORAL_TABLET | Freq: Every day | ORAL | Status: DC

## 2014-05-07 NOTE — Telephone Encounter (Signed)
i usually recommend this for 12 weeks then off.  Would repeat liver tests ever 6 weeks while on medication, though.

## 2014-05-08 NOTE — Telephone Encounter (Signed)
Pt.notified

## 2014-07-19 ENCOUNTER — Encounter: Payer: 59 | Admitting: Internal Medicine

## 2014-07-20 ENCOUNTER — Ambulatory Visit (INDEPENDENT_AMBULATORY_CARE_PROVIDER_SITE_OTHER): Payer: 59 | Admitting: Internal Medicine

## 2014-07-20 VITALS — BP 120/80 | HR 74 | Temp 98.2°F | Resp 16 | Ht 73.0 in | Wt 201.0 lb

## 2014-07-20 DIAGNOSIS — Z0000 Encounter for general adult medical examination without abnormal findings: Secondary | ICD-10-CM

## 2014-07-20 DIAGNOSIS — R7989 Other specified abnormal findings of blood chemistry: Secondary | ICD-10-CM

## 2014-07-20 DIAGNOSIS — R5383 Other fatigue: Secondary | ICD-10-CM

## 2014-07-20 DIAGNOSIS — B351 Tinea unguium: Secondary | ICD-10-CM

## 2014-07-20 DIAGNOSIS — R946 Abnormal results of thyroid function studies: Secondary | ICD-10-CM

## 2014-07-20 DIAGNOSIS — J302 Other seasonal allergic rhinitis: Secondary | ICD-10-CM

## 2014-07-20 DIAGNOSIS — Z79899 Other long term (current) drug therapy: Secondary | ICD-10-CM

## 2014-07-20 LAB — POCT CBC
Granulocyte percent: 65.1 %G (ref 37–80)
HEMATOCRIT: 41.9 % — AB (ref 43.5–53.7)
Hemoglobin: 12.8 g/dL — AB (ref 14.1–18.1)
Lymph, poc: 0.9 (ref 0.6–3.4)
MCH: 27 pg (ref 27–31.2)
MCHC: 30.7 g/dL — AB (ref 31.8–35.4)
MCV: 87.9 fL (ref 80–97)
MID (CBC): 0.3 (ref 0–0.9)
MPV: 7.8 fL (ref 0–99.8)
PLATELET COUNT, POC: 170 10*3/uL (ref 142–424)
POC Granulocyte: 2.3 (ref 2–6.9)
POC LYMPH %: 26.7 % (ref 10–50)
POC MID %: 8.2 %M (ref 0–12)
RBC: 4.77 M/uL (ref 4.69–6.13)
RDW, POC: 13.3 %
WBC: 3.5 10*3/uL — AB (ref 4.6–10.2)

## 2014-07-20 LAB — POCT URINALYSIS DIPSTICK
BILIRUBIN UA: NEGATIVE
Blood, UA: NEGATIVE
Glucose, UA: NEGATIVE
KETONES UA: NEGATIVE
Leukocytes, UA: NEGATIVE
NITRITE UA: NEGATIVE
PH UA: 5
Protein, UA: NEGATIVE
Spec Grav, UA: 1.025
Urobilinogen, UA: 0.2

## 2014-07-20 LAB — POCT UA - MICROSCOPIC ONLY
BACTERIA, U MICROSCOPIC: NEGATIVE
CRYSTALS, UR, HPF, POC: NEGATIVE
Casts, Ur, LPF, POC: NEGATIVE
Mucus, UA: POSITIVE
Yeast, UA: NEGATIVE

## 2014-07-20 NOTE — Progress Notes (Signed)
   Subjective:    Patient ID: Ethan Lee, male    DOB: 10-Aug-1951, 63 y.o.   MRN: [id number]  HPI  Patient is being seen today for his annual physical. He is asking to have blood work done today. Family history of cancer. Patient has mentioned he has been feeling tired more for the last 7 to 8 months.  Onychomycosis is improving, allergys , is controlled. Hx of elevated TSH Review of Systems  Constitutional: Negative.   HENT: Negative.   Eyes: Negative.   Respiratory: Negative.   Cardiovascular: Negative.   Gastrointestinal: Negative.   Endocrine: Negative.   Genitourinary: Negative.   Musculoskeletal: Positive for arthralgias.  Skin: Negative.   Allergic/Immunologic: Positive for environmental allergies and food allergies.  Neurological: Negative.   Hematological: Negative.   Psychiatric/Behavioral: Negative.        Objective:   Physical Exam  Constitutional: He is oriented to person, place, and time. He appears well-developed and well-nourished. No distress.  HENT:  Head: Normocephalic and atraumatic.  Right Ear: External ear normal.  Left Ear: External ear normal.  Nose: Nose normal.  Eyes: Conjunctivae and EOM are normal. Pupils are equal, round, and reactive to light.  Neck: Normal range of motion. Neck supple. No thyromegaly present.  Cardiovascular: Normal rate, regular rhythm, normal heart sounds and intact distal pulses.   Pulmonary/Chest: Effort normal and breath sounds normal.  Abdominal: Soft. Bowel sounds are normal.  Genitourinary: Rectum normal, prostate normal and penis normal.  Musculoskeletal: Normal range of motion.  Neurological: He is alert and oriented to person, place, and time. He has normal reflexes. He displays normal reflexes. No cranial nerve deficit. Coordination normal.  Skin: No rash noted. No erythema. No pallor.  Psychiatric: He has a normal mood and affect. His behavior is normal. Judgment and thought content normal.    EKG ok Lab  done     Assessment & Plan:  Fatigue/Onychomycosis/Allergys/Elevated TSH RF meds 1 year

## 2014-07-20 NOTE — Patient Instructions (Signed)
Hypothyroidism The thyroid is a large gland located in the lower front of your neck. The thyroid gland helps control metabolism. Metabolism is how your body handles food. It controls metabolism with the hormone thyroxine. When this gland is underactive (hypothyroid), it produces too little hormone.  CAUSES These include:   Absence or destruction of thyroid tissue.  Goiter due to iodine deficiency.  Goiter due to medications.  Congenital defects (since birth).  Problems with the pituitary. This causes a lack of TSH (thyroid stimulating hormone). This hormone tells the thyroid to turn out more hormone. SYMPTOMS  Lethargy (feeling as though you have no energy)  Cold intolerance  Weight gain (in spite of normal food intake)  Dry skin  Coarse hair  Menstrual irregularity (if severe, may lead to infertility)  Slowing of thought processes Cardiac problems are also caused by insufficient amounts of thyroid hormone. Hypothyroidism in the newborn is cretinism, and is an extreme form. It is important that this form be treated adequately and immediately or it will lead rapidly to retarded physical and mental development. DIAGNOSIS  To prove hypothyroidism, your caregiver may do blood tests and ultrasound tests. Sometimes the signs are hidden. It may be necessary for your caregiver to watch this illness with blood tests either before or after diagnosis and treatment. TREATMENT  Low levels of thyroid hormone are increased by using synthetic thyroid hormone. This is a safe, effective treatment. It usually takes about four weeks to gain the full effects of the medication. After you have the full effect of the medication, it will generally take another four weeks for problems to leave. Your caregiver may start you on low doses. If you have had heart problems the dose may be gradually increased. It is generally not an emergency to get rapidly to normal. HOME CARE INSTRUCTIONS   Take your  medications as your caregiver suggests. Let your caregiver know of any medications you are taking or start taking. Your caregiver will help you with dosage schedules.  As your condition improves, your dosage needs may increase. It will be necessary to have continuing blood tests as suggested by your caregiver.  Report all suspected medication side effects to your caregiver. SEEK MEDICAL CARE IF: Seek medical care if you develop:  Sweating.  Tremulousness (tremors).  Anxiety.  Rapid weight loss.  Heat intolerance.  Emotional swings.  Diarrhea.  Weakness. SEEK IMMEDIATE MEDICAL CARE IF:  You develop chest pain, an irregular heart beat (palpitations), or a rapid heart beat. MAKE SURE YOU:   Understand these instructions.  Will watch your condition.  Will get help right away if you are not doing well or get worse. Document Released: 09/17/2005 Document Revised: 12/10/2011 Document Reviewed: 05/07/2008 [employer] Patient Information 2015 [employer]. This information is not intended to replace advice given to you by your health care provider. Make sure you discuss any questions you have with your health care provider.  

## 2014-07-21 LAB — COMPREHENSIVE METABOLIC PANEL WITH GFR
ALT: 13 IU/L (ref 0–44)
AST: 17 IU/L (ref 0–40)
Albumin/Globulin Ratio: 2.3 (ref 1.1–2.5)
Albumin: 4.9 g/dL — ABNORMAL HIGH (ref 3.6–4.8)
Alkaline Phosphatase: 60 IU/L (ref 39–117)
BUN/Creatinine Ratio: 16 (ref 10–22)
BUN: 17 mg/dL (ref 8–27)
CO2: 25 mmol/L (ref 18–29)
Calcium: 9.5 mg/dL (ref 8.6–10.2)
Chloride: 102 mmol/L (ref 97–108)
Creatinine, Ser: 1.07 mg/dL (ref 0.76–1.27)
GFR calc Af Amer: 85 mL/min/1.73 (ref >59)
GFR calc non Af Amer: 73 mL/min/1.73 (ref >59)
Globulin, Total: 2.1 g/dL (ref 1.5–4.5)
Glucose: 99 mg/dL (ref 65–99)
Potassium: 4.4 mmol/L (ref 3.5–5.2)
Sodium: 142 mmol/L (ref 134–144)
Total Bilirubin: 0.4 mg/dL (ref 0.0–1.2)
Total Protein: 7 g/dL (ref 6.0–8.5)

## 2014-07-21 LAB — CBC

## 2014-07-21 LAB — LIPID PANEL
Chol/HDL Ratio: 3.6 ratio (ref 0.0–5.0)
Cholesterol, Total: 174 mg/dL (ref 100–199)
HDL: 48 mg/dL (ref >39)
LDL Calculated: 113 mg/dL — ABNORMAL HIGH (ref 0–99)
Triglycerides: 67 mg/dL (ref 0–149)
VLDL Cholesterol Cal: 13 mg/dL (ref 5–40)

## 2014-07-21 LAB — PSA: PSA: 1.4 ng/mL (ref 0.0–4.0)

## 2014-07-21 LAB — TSH: TSH: 4.99 u[IU]/mL — ABNORMAL HIGH (ref 0.450–4.500)

## 2014-07-23 ENCOUNTER — Other Ambulatory Visit: Payer: Self-pay | Admitting: Family Medicine

## 2014-07-23 LAB — IFOBT (OCCULT BLOOD): IFOBT: NEGATIVE

## 2015-07-26 ENCOUNTER — Ambulatory Visit (INDEPENDENT_AMBULATORY_CARE_PROVIDER_SITE_OTHER): Payer: 59 | Admitting: Internal Medicine

## 2015-07-26 VITALS — BP 133/74 | HR 67 | Temp 97.7°F | Resp 16 | Ht 73.0 in | Wt 197.0 lb

## 2015-07-26 DIAGNOSIS — Z800 Family history of malignant neoplasm of digestive organs: Secondary | ICD-10-CM | POA: Diagnosis not present

## 2015-07-26 DIAGNOSIS — Z0000 Encounter for general adult medical examination without abnormal findings: Secondary | ICD-10-CM | POA: Diagnosis not present

## 2015-07-26 DIAGNOSIS — Z7189 Other specified counseling: Secondary | ICD-10-CM | POA: Diagnosis not present

## 2015-07-26 DIAGNOSIS — Z8051 Family history of malignant neoplasm of kidney: Secondary | ICD-10-CM

## 2015-07-26 LAB — POCT URINALYSIS DIP (MANUAL ENTRY)
Bilirubin, UA: NEGATIVE
Blood, UA: NEGATIVE
Glucose, UA: NEGATIVE
Ketones, POC UA: NEGATIVE
Leukocytes, UA: NEGATIVE
NITRITE UA: NEGATIVE
Protein Ur, POC: NEGATIVE
Spec Grav, UA: 1.02
UROBILINOGEN UA: 0.2
pH, UA: 5.5

## 2015-07-26 LAB — LIPID PANEL
CHOL/HDL RATIO: 4 ratio (ref <=5.0)
CHOLESTEROL: 174 mg/dL (ref 125–200)
HDL: 43 mg/dL (ref >=40)
LDL Cholesterol: 111 mg/dL (ref <130)
TRIGLYCERIDES: 102 mg/dL (ref <150)
VLDL: 20 mg/dL (ref <30)

## 2015-07-26 LAB — POCT CBC
GRANULOCYTE PERCENT: 60.3 % (ref 37–80)
HCT, POC: 40 % — AB (ref 43.5–53.7)
Hemoglobin: 13.8 g/dL — AB (ref 14.1–18.1)
Lymph, poc: 1.2 (ref 0.6–3.4)
MCH: 29 pg (ref 27–31.2)
MCHC: 34.5 g/dL (ref 31.8–35.4)
MCV: 83.9 fL (ref 80–97)
MID (cbc): 0.4 (ref 0–0.9)
MPV: 7.5 fL (ref 0–99.8)
PLATELET COUNT, POC: 166 10*3/uL (ref 142–424)
POC Granulocyte: 2.5 (ref 2–6.9)
POC LYMPH PERCENT: 29.3 %L (ref 10–50)
POC MID %: 10.4 % (ref 0–12)
RBC: 4.76 M/uL (ref 4.69–6.13)
RDW, POC: 12.9 %
WBC: 4.2 10*3/uL — AB (ref 4.6–10.2)

## 2015-07-26 LAB — TSH: TSH: 5.312 u[IU]/mL — ABNORMAL HIGH (ref 0.350–4.500)

## 2015-07-26 LAB — COMPREHENSIVE METABOLIC PANEL
ALK PHOS: 53 U/L (ref 40–115)
ALT: 12 U/L (ref 9–46)
AST: 16 U/L (ref 10–35)
Albumin: 4.5 g/dL (ref 3.6–5.1)
BUN: 18 mg/dL (ref 7–25)
CALCIUM: 9.8 mg/dL (ref 8.6–10.3)
CO2: 30 mmol/L (ref 20–31)
Chloride: 102 mmol/L (ref 98–110)
Creat: 1.14 mg/dL (ref 0.70–1.25)
GLUCOSE: 91 mg/dL (ref 65–99)
POTASSIUM: 4.4 mmol/L (ref 3.5–5.3)
Sodium: 139 mmol/L (ref 135–146)
TOTAL PROTEIN: 7.4 g/dL (ref 6.1–8.1)
Total Bilirubin: 0.7 mg/dL (ref 0.2–1.2)

## 2015-07-26 LAB — POC MICROSCOPIC URINALYSIS (UMFC): MUCUS RE: ABSENT

## 2015-07-26 LAB — HEPATITIS C ANTIBODY: HCV Ab: NEGATIVE

## 2015-07-26 NOTE — Progress Notes (Signed)
Patient ID: Ethan Lee, male   DOB: November 07, 1950, 64 y.o.   MRN: [id number]   07/26/2015 at 10:39 AM  Ethan Lee / DOB: August 15, 1951 / MRN: [id number]  Problem list reviewed and updated by me where necessary.   SUBJECTIVE  Ethan Lee is a 64 y.o. well appearing male presenting for the chief complaint of needing an annual exam.     He  has a past medical history of Allergy.    Medications reviewed and updated by myself where necessary, and exist elsewhere in the encounter.   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 He  reports that he has never smoked. He does not have any smokeless tobacco history on file. He reports that he drinks about 1.8 oz of alcohol per week. He reports that he does not use illicit drugs. He  reports that he currently engages in sexual activity. The patient  has past surgical history that includes Knee surgery; Vocal cord polyps; and Tonsillectomy and adenoidectomy.  His family history includes Cancer in his brother, brother, and mother.  Review of Systems  Constitutional: Negative.  Negative for fever.  HENT: Negative.   Eyes: Negative.   Respiratory: Negative.  Negative for shortness of breath.   Cardiovascular: Negative.  Negative for chest pain.  Gastrointestinal: Negative.  Negative for nausea.  Genitourinary: Negative.   Skin: Negative.  Negative for rash.  Neurological: Negative.  Negative for dizziness and headaches.  Endo/Heme/Allergies: Positive for environmental allergies.  Psychiatric/Behavioral: Negative.     OBJECTIVE  His  height is 6\' 1"  (1.854 m) and weight is 197 lb (89.359 kg). His temperature is 97.7 F (36.5 C). His blood pressure is 133/74 and his pulse is 67. His respiration is 16.  The patient's body mass index is 26 kg/(m^2).  Physical Exam  Constitutional: He is oriented to person, place, and time. He appears well-developed and well-nourished. No distress.  HENT:  Head: Normocephalic.  Right  Ear: External ear normal.  Left Ear: External ear normal.  Nose: Nose normal.  Mouth/Throat: Oropharynx is clear and moist.  Eyes: Conjunctivae and EOM are normal. Pupils are equal, round, and reactive to light. No scleral icterus.  Neck: Normal range of motion. Neck supple. No tracheal deviation present. No thyromegaly present.  Cardiovascular: Normal rate, regular rhythm, normal heart sounds and intact distal pulses.   No murmur heard. Respiratory: Effort normal and breath sounds normal.  GI: Soft. Bowel sounds are normal. He exhibits no mass. There is no tenderness.  Genitourinary: Rectum normal, prostate normal and penis normal.  Musculoskeletal: Normal range of motion.  Neurological: He is alert and oriented to person, place, and time. He has normal reflexes. He displays normal reflexes. No cranial nerve deficit. He exhibits normal muscle tone. Coordination normal.  Psychiatric: He has a normal mood and affect. His behavior is normal. Judgment and thought content normal.    Results for orders placed or performed in visit on 07/26/15 (from the past 24 hour(s))  POCT CBC     Status: Abnormal   Collection Time: 07/26/15  8:59 AM  Result Value Ref Range   WBC 4.2 (A) 4.6 - 10.2 K/uL   Lymph, poc 1.2 0.6 - 3.4   POC LYMPH PERCENT 29.3 10 - 50 %L   MID (cbc) 0.4 0 - 0.9   POC MID % 10.4 0 - 12 %M   POC Granulocyte 2.5 2 - 6.9   Granulocyte percent 60.3 37 - 80 %G   RBC 4.76 4.69 -  6.13 M/uL   Hemoglobin 13.8 (A) 14.1 - 18.1 g/dL   HCT, POC 40.0 (A) 43.5 - 53.7 %   MCV 83.9 80 - 97 fL   MCH, POC 29.0 27 - 31.2 pg   MCHC 34.5 31.8 - 35.4 g/dL   RDW, POC 12.9 %   Platelet Count, POC 166 142 - 424 K/uL   MPV 7.5 0 - 99.8 fL  POCT urinalysis dipstick     Status: None   Collection Time: 07/26/15  8:59 AM  Result Value Ref Range   Color, UA yellow yellow   Clarity, UA clear clear   Glucose, UA negative negative   Bilirubin, UA negative negative   Ketones, POC UA negative negative     Spec Grav, UA 1.020    Blood, UA negative negative   pH, UA 5.5    Protein Ur, POC negative negative   Urobilinogen, UA 0.2    Nitrite, UA Negative Negative   Leukocytes, UA Negative Negative  POCT Microscopic Urinalysis ([facility])     Status: Abnormal   Collection Time: 07/26/15  8:59 AM  Result Value Ref Range   WBC,UR,HPF,POC None None WBC/hpf   RBC,UR,HPF,POC None None RBC/hpf   Bacteria None None, Too numerous to count   Mucus Absent Absent   Epithelial Cells, UR Per Microscopy Few (A) None, Too numerous to count cells/hpf    ASSESSMENT & PLAN  Ethan Lee was seen today for annual exam.  Diagnoses and all orders for this visit:  Annual physical exam -     POCT CBC -     POCT urinalysis dipstick -     POCT Microscopic Urinalysis ([facility]) -     Comprehensive metabolic panel -     Lipid panel -     PSA -     TSH -     Hepatitis C antibody -     EKG 12-Lead  FHx: colon cancer -     POCT CBC -     POCT urinalysis dipstick -     POCT Microscopic Urinalysis ([facility]) -     Comprehensive metabolic panel -     Lipid panel -     PSA -     TSH -     Hepatitis C antibody -     EKG 12-Lead  FHx: renal cell carcinoma -     POCT CBC -     POCT urinalysis dipstick -     POCT Microscopic Urinalysis ([facility]) -     Comprehensive metabolic panel -     Lipid panel -     PSA -     TSH -     Hepatitis C antibody -     EKG 12-Lead  Encounter for medication review and counseling -     POCT CBC -     POCT urinalysis dipstick -     POCT Microscopic Urinalysis ([facility]) -     Comprehensive metabolic panel -     Lipid panel -     PSA -     TSH -     Hepatitis C antibody -     EKG 12-Lead

## 2015-07-26 NOTE — Patient Instructions (Signed)
Immunization Schedule, Adult  Influenza vaccine.  All adults should be immunized every year.  All adults, including pregnant women and people with hives-only allergy to eggs can receive the inactivated influenza (IIV) vaccine.  Adults aged 64-49 years can receive the recombinant influenza (RIV) vaccine. The RIV vaccine does not contain any egg protein.  Adults aged 76 years or older can receive the standard-dose IIV or the high-dose IIV.  Tetanus, diphtheria, and acellular pertussis (Td, Tdap) vaccine.  Pregnant women should receive 1 dose of Tdap vaccine during each pregnancy. The dose should be obtained regardless of the length of time since the last dose. Immunization is preferred during the 27th to 36th week of gestation.  An adult who has not previously received Tdap or who does not know his or her vaccine status should receive 1 dose of Tdap. This initial dose should be followed by tetanus and diphtheria toxoids (Td) booster doses every 10 years.  Adults with an unknown or incomplete history of completing a 3-dose immunization series with Td-containing vaccines should begin or complete a primary immunization series including a Tdap dose.  Adults should receive a Td booster every 10 years.  Varicella vaccine.  An adult without evidence of immunity to varicella should receive 2 doses or a second dose if he or she has previously received 1 dose.  Pregnant females who do not have evidence of immunity should receive the first dose after pregnancy. This first dose should be obtained before leaving the health care facility. The second dose should be obtained 4-8 weeks after the first dose.  Human papillomavirus (HPV) vaccine.  Females aged 13-26 years who have not received the vaccine previously should obtain the 3-dose series.  The vaccine is not recommended for use in pregnant females. However, pregnancy testing is not needed before receiving a dose. If a male is found to be  pregnant after receiving a dose, no treatment is needed. In that case, the remaining doses should be delayed until after the pregnancy.  Males aged 37-21 years who have not received the vaccine previously should receive the 3-dose series. Males aged 22-26 years may be immunized.  Immunization is recommended through the age of 93 years for any male who has sex with males and did not get any or all doses earlier.  Immunization is recommended for any person with an immunocompromised condition through the age of 71 years if he or she did not get any or all doses earlier.  During the 3-dose series, the second dose should be obtained 4-8 weeks after the first dose. The third dose should be obtained 24 weeks after the first dose and 16 weeks after the second dose.  Zoster vaccine.  One dose is recommended for adults aged 73 years or older unless certain conditions are present.  Measles, mumps, and rubella (MMR) vaccine.  Adults born before 35 generally are considered immune to measles and mumps.  Adults born in [birth year] or later should have 1 or more doses of MMR vaccine unless there is a contraindication to the vaccine or there is laboratory evidence of immunity to each of the three diseases.  A routine second dose of MMR vaccine should be obtained at least 28 days after the first dose for students attending postsecondary schools, health care workers, or international travelers.  People who received inactivated measles vaccine or an unknown type of measles vaccine during 1963-1967 should receive 2 doses of MMR vaccine.  People who received inactivated mumps vaccine or an unknown type  of mumps vaccine before 1979 and are at high risk for mumps infection should consider immunization with 2 doses of MMR vaccine.  For females of childbearing age, rubella immunity should be determined. If there is no evidence of immunity, females who are not pregnant should be vaccinated. If there is no evidence of  immunity, females who are pregnant should delay immunization until after pregnancy.  Unvaccinated health care workers born before 1957 who lack laboratory evidence of measles, mumps, or rubella immunity or laboratory confirmation of disease should consider measles and mumps immunization with 2 doses of MMR vaccine or rubella immunization with 1 dose of MMR vaccine.  Pneumococcal 13-valent conjugate (PCV13) vaccine.  When indicated, a person who is uncertain of his or her immunization history and has no record of immunization should receive the PCV13 vaccine.  An adult aged 19 years or older who has certain medical conditions and has not been previously immunized should receive 1 dose of PCV13 vaccine. This PCV13 should be followed with a dose of pneumococcal polysaccharide (PPSV23) vaccine. The PPSV23 vaccine dose should be obtained at least 8 weeks after the dose of PCV13 vaccine.  An adult aged 19 years or older who has certain medical conditions and previously received 1 or more doses of PPSV23 vaccine should receive 1 dose of PCV13. The PCV13 vaccine dose should be obtained 1 or more years after the last PPSV23 vaccine dose.  Pneumococcal polysaccharide (PPSV23) vaccine.  When PCV13 is also indicated, PCV13 should be obtained first.  All adults aged 65 years and older should be immunized.  An adult younger than age 65 years who has certain medical conditions should be immunized.  Any person who resides in a nursing home or long-term care facility should be immunized.  An adult smoker should be immunized.  People with an immunocompromised condition and certain other conditions should receive both PCV13 and PPSV23 vaccines.  People with human immunodeficiency virus (HIV) infection should be immunized as soon as possible after diagnosis.  Immunization during chemotherapy or radiation therapy should be avoided.  Routine use of PPSV23 vaccine is not recommended for American Indians,  [place]atives, or people younger than 65 years unless there are medical conditions that require PPSV23 vaccine.  When indicated, people who have unknown immunization and have no record of immunization should receive PPSV23 vaccine.  One-time revaccination 5 years after the first dose of PPSV23 is recommended for people aged 19-64 years who have chronic kidney failure, nephrotic syndrome, asplenia, or immunocompromised conditions.  People who received 1-2 doses of PPSV23 before age 65 years should receive another dose of PPSV23 vaccine at age 65 years or later if at least 5 years have passed since the previous dose.  Doses of PPSV23 are not needed for people immunized with PPSV23 at or after age 65 years.  Meningococcal vaccine.  Adults with asplenia or persistent complement component deficiencies should receive 2 doses of quadrivalent meningococcal conjugate (MenACWY-D) vaccine. The doses should be obtained at least 2 months apart.  Microbiologists working with certain meningococcal bacteria, military recruits, people at risk during an outbreak, and people who travel to or live in countries with a high rate of meningitis should be immunized.  A first-year college student up through age 21 years who is living in a residence hall should receive a dose if he or she did not receive a dose on or after his or her 16th birthday.  Adults who have certain high-risk conditions should receive one or more doses   be obtained at least 2 months apart.    Microbiologists working with certain meningococcal bacteria, military recruits, people at risk during an outbreak, and people who travel to or live in countries with a high rate of meningitis should be immunized.    A first-year college student up through age 21 years who is living in a residence hall should receive a dose if he or she did not receive a dose on or after his or her 16th birthday.    Adults who have certain high-risk conditions should receive one or more doses of vaccine.  · Hepatitis A vaccine.    Adults who wish to be protected from this disease, have certain high-risk conditions, work with hepatitis A-infected animals, work in hepatitis A research labs, or travel to or work in countries with a high rate of hepatitis A should be immunized.    Adults who were previously unvaccinated and who anticipate close contact with an international adoptee during the first 60 days after arrival in the [place] from a country with a high rate of hepatitis A should  be immunized.  · Hepatitis B vaccine.    Adults who wish to be protected from this disease, have certain high-risk conditions, may be exposed to blood or other infectious body fluids, are household contacts or sex partners of hepatitis B positive people, are clients or workers in certain care facilities, or travel to or work in countries with a high rate of hepatitis B should be immunized.  · Haemophilus influenzae type b (Hib) vaccine.    A previously unvaccinated person with asplenia or sickle cell disease or having a scheduled splenectomy should receive 1 dose of Hib vaccine.    Regardless of previous immunization, a recipient of a hematopoietic stem cell transplant should receive a 3-dose series 6-12 months after his or her successful transplant.    Hib vaccine is not recommended for adults with HIV infection.     This information is not intended to replace advice given to you by your health care provider. Make sure you discuss any questions you have with your health care provider.     Document Released: 12/08/2003 Document Revised: 01/12/2013 Document Reviewed: 11/04/2012  Elsevier Interactive Patient Education ©2016 [employer]

## 2015-07-27 LAB — PSA: PSA: 1.31 ng/mL (ref <=4.00)

## 2015-09-11 ENCOUNTER — Ambulatory Visit (INDEPENDENT_AMBULATORY_CARE_PROVIDER_SITE_OTHER): Payer: 59 | Admitting: Physician Assistant

## 2015-09-11 VITALS — BP 122/80 | HR 53 | Temp 97.8°F | Resp 18 | Ht 73.0 in | Wt 200.0 lb

## 2015-09-11 DIAGNOSIS — D649 Anemia, unspecified: Secondary | ICD-10-CM | POA: Diagnosis not present

## 2015-09-11 DIAGNOSIS — R710 Precipitous drop in hematocrit: Secondary | ICD-10-CM

## 2015-09-11 DIAGNOSIS — K219 Gastro-esophageal reflux disease without esophagitis: Secondary | ICD-10-CM | POA: Diagnosis not present

## 2015-09-11 DIAGNOSIS — R059 Cough, unspecified: Secondary | ICD-10-CM

## 2015-09-11 DIAGNOSIS — R7989 Other specified abnormal findings of blood chemistry: Secondary | ICD-10-CM

## 2015-09-11 DIAGNOSIS — R946 Abnormal results of thyroid function studies: Secondary | ICD-10-CM

## 2015-09-11 DIAGNOSIS — R05 Cough: Secondary | ICD-10-CM | POA: Diagnosis not present

## 2015-09-11 LAB — POCT CBC
GRANULOCYTE PERCENT: 62.1 % (ref 37–80)
HEMATOCRIT: 41.7 % — AB (ref 43.5–53.7)
HEMOGLOBIN: 14.2 g/dL (ref 14.1–18.1)
Lymph, poc: 1.5 (ref 0.6–3.4)
MCH: 28.7 pg (ref 27–31.2)
MCHC: 34 g/dL (ref 31.8–35.4)
MCV: 84.4 fL (ref 80–97)
MID (CBC): 0.5 (ref 0–0.9)
MPV: 7.6 fL (ref 0–99.8)
POC Granulocyte: 3.4 (ref 2–6.9)
POC LYMPH %: 28 % (ref 10–50)
POC MID %: 9.9 %M (ref 0–12)
Platelet Count, POC: 164 10*3/uL (ref 142–424)
RBC: 4.94 M/uL (ref 4.69–6.13)
RDW, POC: 12.8 %
WBC: 5.4 10*3/uL (ref 4.6–10.2)

## 2015-09-11 MED ORDER — PANTOPRAZOLE SODIUM 40 MG PO TBEC: 40.0000 mg | DELAYED_RELEASE_TABLET | Freq: Every day | ORAL | Status: DC

## 2015-09-11 NOTE — Patient Instructions (Signed)
I would like to see you back in clinic in roughly 4 weeks.  Please make an appointment if you would like at 104. We will contact you with the results of your tests.  Philis Fendt, MS, PA-C 3:56 PM, 09/11/2015

## 2015-09-11 NOTE — Progress Notes (Signed)
09/11/2015 4:06 PM   DOB: 1951-05-29 / MRN: [id number]  SUBJECTIVE:  Ethan Lee is a 64 y.o. male presenting to discuss his thyroid. Reports that he has had two elevated TSH levels now and would like this evaluated further.       He complains of cough that started roughly 4 days ago. Denies sore throat, congestion, rhinorrhea and any other symptoms of a URI.  Does report he has been getting indigestion roughly 1 time per week for the last two months and this is new. He take 81 mg ASA every other day.  Denies heavy use of other NSAIDs. His colonoscopies are current however no records exist in [facility].   He is a never smoker and has no history of asthma.  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   He  has a past medical history of Allergy.    He  reports that he has never smoked. He does not have any smokeless tobacco history on file. He reports that he drinks about 1.8 oz of alcohol per week. He reports that he does not use illicit drugs. He  reports that he currently engages in sexual activity. The patient  has past surgical history that includes Knee surgery; Vocal cord polyps; and Tonsillectomy and adenoidectomy.  His family history includes Cancer in his brother, brother, and mother.  Review of Systems  Constitutional: Negative for fever and chills.  Eyes: Negative for blurred vision.  Respiratory: Negative for cough and shortness of breath.   Cardiovascular: Negative for chest pain.  Gastrointestinal: Negative for nausea and abdominal pain.  Genitourinary: Negative for dysuria, urgency and frequency.  Musculoskeletal: Negative for myalgias.  Skin: Negative for rash.  Neurological: Negative for dizziness, tingling and headaches.  Psychiatric/Behavioral: Negative for depression. The patient is not nervous/anxious.     Problem list and medications reviewed and updated by myself where necessary, and exist elsewhere in the encounter.   OBJECTIVE:  BP 122/80 mmHg  Pulse  53  Temp(Src) 97.8 F (36.6 C) (Oral)  Resp 18  Ht 6\' 1"  (1.854 m)  Wt 200 lb (90.719 kg)  BMI 26.39 kg/m2  SpO2 98% CrCl cannot be calculated (Patient has no serum creatinine result on file.).  Physical Exam  Constitutional: He is oriented to person, place, and time. He appears well-developed. He does not appear ill.  Eyes: Conjunctivae and EOM are normal. Pupils are equal, round, and reactive to light.  Cardiovascular: Normal rate.   Pulmonary/Chest: Effort normal.  Abdominal: Soft. Bowel sounds are normal. He exhibits no distension and no mass. There is no tenderness. There is no rebound and no guarding.  Musculoskeletal: Normal range of motion.  Neurological: He is alert and oriented to person, place, and time. No cranial nerve deficit. Coordination normal.  Skin: Skin is warm and dry. He is not diaphoretic.  Psychiatric: He has a normal mood and affect.  Nursing note and vitals reviewed.  Lab Results  Component Value Date   TSH 5.312* 07/26/2015     Results for orders placed or performed in visit on 09/11/15 (from the past 48 hour(s))  POCT CBC     Status: Abnormal   Collection Time: 09/11/15  3:29 PM  Result Value Ref Range   WBC 5.4 4.6 - 10.2 K/uL   Lymph, poc 1.5 0.6 - 3.4   POC LYMPH PERCENT 28.0 10 - 50 %L   MID (cbc) 0.5 0 - 0.9   POC MID % 9.9 0 - 12 %M  POC Granulocyte 3.4 2 - 6.9   Granulocyte percent 62.1 37 - 80 %G   RBC 4.94 4.69 - 6.13 M/uL   Hemoglobin 14.2 14.1 - 18.1 g/dL   HCT, POC 41.7 (A) 43.5 - 53.7 %   MCV 84.4 80 - 97 fL   MCH, POC 28.7 27 - 31.2 pg   MCHC 34.0 31.8 - 35.4 g/dL   RDW, POC 12.8 %   Platelet Count, POC 164 142 - 424 K/uL   MPV 7.6 0 - 99.8 fL    ASSESSMENT AND PLAN  Ethan Lee was seen today for cough and thyroid.  Diagnoses and all orders for this visit:  Elevated TSH -     TSH -     T4, free -     T3, Free -     Thyroid peroxidase antibody -     POCT CBC  Gastroesophageal reflux disease, esophagitis presence not  specified: Patient may have an ulcer which is driving GERD which may be driving cough.  CBC points away from a bacterial etiology and he has no other signs or symptoms of a URI. Will screen for H. Pylori and start pantoprazole 40 mg po qd for now.  Will see him back in one month for a CBC recheck, and if normal at that time will likely send to GI for further workup if he is negative for H. Pylori.  -     Cancel: COMPLETE METABOLIC PANEL WITH GFR -     H. pylori breath test -     CMP and Liver -     pantoprazole (PROTONIX) 40 MG tablet; Take 1 tablet (40 mg total) by mouth daily.  Cough: Managed with the 2nd problem.   Decreased hemoglobin or hematocrit: Mildly decreased hemoglobin, however this would corroborate his symptoms, which are also mild.  Managed with the second problem.  -     Cancel: H. pylori breath test -     Cancel: COMPLETE METABOLIC PANEL WITH GFR -     H. pylori breath test    The patient was advised to call or return to clinic if he does not see an improvement in symptoms or to seek the care of the closest emergency department if he worsens with the above plan.   Philis Fendt, MHS, PA-C [facility] 09/11/2015 4:06 PM

## 2015-09-13 LAB — CMP AND LIVER
ALT: 13 IU/L (ref 0–44)
AST: 18 IU/L (ref 0–40)
Albumin: 4.4 g/dL (ref 3.6–4.8)
Alkaline Phosphatase: 64 IU/L (ref 39–117)
BILIRUBIN, DIRECT: 0.09 mg/dL (ref 0.00–0.40)
BUN: 17 mg/dL (ref 8–27)
Bilirubin Total: 0.3 mg/dL (ref 0.0–1.2)
CALCIUM: 9.5 mg/dL (ref 8.6–10.2)
CO2: 23 mmol/L (ref 18–29)
CREATININE: 0.95 mg/dL (ref 0.76–1.27)
Chloride: 102 mmol/L (ref 96–106)
GFR, EST AFRICAN AMERICAN: 97 mL/min/{1.73_m2} (ref >59)
GFR, EST NON AFRICAN AMERICAN: 84 mL/min/{1.73_m2} (ref >59)
GLUCOSE: 84 mg/dL (ref 65–99)
Potassium: 4.8 mmol/L (ref 3.5–5.2)
Sodium: 141 mmol/L (ref 134–144)
Total Protein: 7 g/dL (ref 6.0–8.5)

## 2015-09-13 LAB — T4, FREE: FREE T4: 0.97 ng/dL (ref 0.82–1.77)

## 2015-09-13 LAB — T3, FREE: T3 FREE: 2.7 pg/mL (ref 2.0–4.4)

## 2015-09-13 LAB — TSH: TSH: 6.26 u[IU]/mL — ABNORMAL HIGH (ref 0.450–4.500)

## 2015-09-13 LAB — THYROID PEROXIDASE ANTIBODY: THYROID PEROXIDASE ANTIBODY: 163 [IU]/mL — AB (ref 0–34)

## 2015-09-14 ENCOUNTER — Other Ambulatory Visit: Payer: Self-pay | Admitting: Physician Assistant

## 2015-09-14 DIAGNOSIS — E039 Hypothyroidism, unspecified: Secondary | ICD-10-CM

## 2015-09-14 LAB — H. PYLORI BREATH TEST: H. PYLORI UBIT: NEGATIVE

## 2015-09-14 MED ORDER — LEVOTHYROXINE SODIUM 25 MCG PO TABS: 25.0000 ug | ORAL_TABLET | Freq: Every day | ORAL | Status: DC

## 2015-09-16 ENCOUNTER — Telehealth: Payer: Self-pay

## 2015-10-12 ENCOUNTER — Ambulatory Visit (INDEPENDENT_AMBULATORY_CARE_PROVIDER_SITE_OTHER): Payer: 59 | Admitting: Physician Assistant

## 2015-10-12 ENCOUNTER — Encounter: Payer: Self-pay | Admitting: Physician Assistant

## 2015-10-12 VITALS — BP 130/82 | HR 71 | Temp 97.9°F | Resp 16 | Ht 73.0 in | Wt 200.4 lb

## 2015-10-12 DIAGNOSIS — E063 Autoimmune thyroiditis: Secondary | ICD-10-CM

## 2015-10-12 DIAGNOSIS — R946 Abnormal results of thyroid function studies: Secondary | ICD-10-CM | POA: Diagnosis not present

## 2015-10-12 DIAGNOSIS — R7989 Other specified abnormal findings of blood chemistry: Secondary | ICD-10-CM

## 2015-10-12 MED ORDER — LEVOTHYROXINE SODIUM 25 MCG PO TABS: 25.0000 ug | ORAL_TABLET | Freq: Every day | ORAL | Status: DC

## 2015-10-12 NOTE — Progress Notes (Signed)
   10/12/2015 3:37 PM   DOB: 1951-09-25 / MRN: [id number]  SUBJECTIVE:  Ethan Lee is a 65 y.o. male presenting for a Thyroid recheck.  Last I saw him was in December of 16 and his TSH had been steadily increasing over time. TPO antibodies drawn and positive.  He was placed on 25 mcg of Synthroid and denies any side effects with treatment.  Denies constipation, overtly dry skin, dry mouth.  He feels well today and denies any complaints.  He is taking 25 synthroid without complaint.    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   He  has a past medical history of Allergy.    He  reports that he has never smoked. He does not have any smokeless tobacco history on file. He reports that he drinks about 1.8 oz of alcohol per week. He reports that he does not use illicit drugs. He  reports that he currently engages in sexual activity. The patient  has past surgical history that includes Knee surgery; Vocal cord polyps; and Tonsillectomy and adenoidectomy.  His family history includes Cancer in his brother, brother, and mother.  Review of Systems  Constitutional: Negative for fever.  Respiratory: Negative for cough.   Gastrointestinal: Negative for nausea.  Skin: Negative for itching and rash.  Neurological: Negative for dizziness and headaches.    Problem list and medications reviewed and updated by myself where necessary, and exist elsewhere in the encounter.   OBJECTIVE:  BP 130/82 mmHg  Pulse 71  Temp(Src) 97.9 F (36.6 C) (Oral)  Resp 16  Ht 6\' 1"  (1.854 m)  Wt 200 lb 6.4 oz (90.901 kg)  BMI 26.45 kg/m2  SpO2 92%  Physical Exam  Constitutional: He is oriented to person, place, and time. He appears well-developed. He does not appear ill.  Eyes: Conjunctivae and EOM are normal. Pupils are equal, round, and reactive to light.  Neck: No thyromegaly present.  Cardiovascular: Normal rate and regular rhythm.   Pulmonary/Chest: Effort normal and breath sounds normal.    Abdominal: He exhibits no distension.  Musculoskeletal: Normal range of motion.  Neurological: He is alert and oriented to person, place, and time. No cranial nerve deficit. Coordination normal.  Skin: Skin is warm and dry. He is not diaphoretic.  Psychiatric: He has a normal mood and affect.  Nursing note and vitals reviewed.   No results found for this or any previous visit (from the past 48 hour(s)).  ASSESSMENT AND PLAN  [name] was seen today for follow-up and bloodwork, cbc.  Diagnoses and all orders for this visit:  TSH elevation:  WIll recheck.  He remains asymptomatic.   -     TSH -     T4, Free -     T3, Free    The patient was advised to call or return to clinic if he does not see an improvement in symptoms or to seek the care of the closest emergency department if he worsens with the above plan.   Philis Fendt, MHS, PA-C [facility] 10/12/2015 3:37 PM

## 2015-10-13 LAB — T3, FREE: T3, Free: 2.8 pg/mL (ref 2.0–4.4)

## 2015-10-13 LAB — CBC
HEMATOCRIT: 40.6 % (ref 37.5–51.0)
Hemoglobin: 13.8 g/dL (ref 12.6–17.7)
MCH: 28.5 pg (ref 26.6–33.0)
MCHC: 34 g/dL (ref 31.5–35.7)
MCV: 84 fL (ref 79–97)
Platelets: 149 10*3/uL — ABNORMAL LOW (ref 150–379)
RBC: 4.85 x10E6/uL (ref 4.14–5.80)
RDW: 13.3 % (ref 12.3–15.4)
WBC: 4.5 10*3/uL (ref 3.4–10.8)

## 2015-10-13 LAB — TSH: TSH: 4 u[IU]/mL (ref 0.450–4.500)

## 2015-10-13 LAB — T4, FREE: FREE T4: 1.11 ng/dL (ref 0.82–1.77)

## 2015-11-15 ENCOUNTER — Telehealth: Payer: Self-pay

## 2015-11-15 DIAGNOSIS — E063 Autoimmune thyroiditis: Secondary | ICD-10-CM

## 2015-11-15 NOTE — Telephone Encounter (Signed)
PATIENT WOULD LIKE MICHAEL CLARK TO KNOW THAT HE NEEDS HIM TO WRITE HIS PRESCRIPTION FOR LEVOTHYROXINE 25 MCG TABLETS. HE HAS HAD 3 REFILLS AND NOW [facility] WILL NOT REFILL IT AGAIN. HE JUST NEEDS IT SENT TO HIS [facility] PLAN. PLEASE CALL HIM WHEN IT HAS BEEN DONE. BEST PHONE [phone] (CELL)  [place]

## 2015-11-16 MED ORDER — LEVOTHYROXINE SODIUM 25 MCG PO TABS: 25.0000 ug | ORAL_TABLET | Freq: Every day | ORAL | Status: DC

## 2015-11-16 NOTE — Telephone Encounter (Signed)
Re-sent pt's Rx to [facility] and notified him on Vm.

## 2015-11-17 NOTE — Telephone Encounter (Signed)
No msg °

## 2015-11-21 ENCOUNTER — Telehealth: Payer: Self-pay

## 2015-11-21 ENCOUNTER — Other Ambulatory Visit: Payer: Self-pay

## 2015-11-21 DIAGNOSIS — E063 Autoimmune thyroiditis: Secondary | ICD-10-CM

## 2015-11-21 MED ORDER — LEVOTHYROXINE SODIUM 25 MCG PO TABS: 25.0000 ug | ORAL_TABLET | Freq: Every day | ORAL | Status: DC

## 2015-11-21 NOTE — Telephone Encounter (Signed)
Pt states that he needs a provider signature not a pa and to please call [phone]

## 2015-11-21 NOTE — Telephone Encounter (Signed)
Resent Rx in MD name.

## 2016-03-15 ENCOUNTER — Ambulatory Visit (INDEPENDENT_AMBULATORY_CARE_PROVIDER_SITE_OTHER): Payer: 59

## 2016-03-15 ENCOUNTER — Ambulatory Visit (INDEPENDENT_AMBULATORY_CARE_PROVIDER_SITE_OTHER): Payer: 59 | Admitting: Physician Assistant

## 2016-03-15 VITALS — BP 108/72 | HR 60 | Temp 97.6°F | Ht 73.5 in | Wt 201.0 lb

## 2016-03-15 DIAGNOSIS — S29012A Strain of muscle and tendon of back wall of thorax, initial encounter: Secondary | ICD-10-CM | POA: Diagnosis not present

## 2016-03-15 DIAGNOSIS — E039 Hypothyroidism, unspecified: Secondary | ICD-10-CM | POA: Insufficient documentation

## 2016-03-15 MED ORDER — CYCLOBENZAPRINE HCL 10 MG PO TABS: 5.0000 mg | ORAL_TABLET | Freq: Three times a day (TID) | ORAL | Status: DC | PRN

## 2016-03-15 NOTE — Progress Notes (Signed)
03/15/2016 8:58 AM   DOB: September 20, 1951 / MRN: [id number]  SUBJECTIVE:  Ethan Lee is a 65 y.o. male presenting for 3 days of right sided rhomboidal pain and tenderness.  Associates pain with deep breathing.  Denies dizziness, SOB, new DOE, chest pain, nausea, diaphoresis.  No neck pain.  Has been hiking in [place] and the pain started on while there. He feels somewhat better today.     He has a history of hypothyroid with his last check being 6 months ago.  Denies skin, hair and nail changes.  No diarrhea or constipation.   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   He  has a past medical history of Allergy.    He  reports that he has never smoked. He has never used smokeless tobacco. He reports that he drinks about 1.8 oz of alcohol per week. He reports that he does not use illicit drugs. He  reports that he currently engages in sexual activity. The patient  has past surgical history that includes Knee surgery; Vocal cord polyps; and Tonsillectomy and adenoidectomy.  His family history includes Cancer in his brother, brother, and mother.  Review of Systems  Constitutional: Negative for fever.  Respiratory: Negative for cough.   Cardiovascular: Negative for chest pain.  Gastrointestinal: Negative for abdominal pain.  Musculoskeletal: Negative for back pain and falls.  Skin: Negative for rash.  Neurological: Negative for dizziness and headaches.    Problem list and medications reviewed and updated by myself where necessary, and exist elsewhere in the encounter.   OBJECTIVE:  BP 108/72 mmHg  Pulse 60  Temp(Src) 97.6 F (36.4 C) (Oral)  Ht 6' 1.5" (1.867 m)  Wt 201 lb (91.173 kg)  BMI 26.16 kg/m2  SpO2 98%  Physical Exam  Constitutional: He is oriented to person, place, and time. He appears well-developed. He does not appear ill.  Eyes: Conjunctivae and EOM are normal. Pupils are equal, round, and reactive to light.  Cardiovascular: Normal rate.     Pulmonary/Chest: Effort normal and breath sounds normal. No respiratory distress. He has no wheezes. He has no rales. He exhibits no tenderness.  Abdominal: He exhibits no distension.  Musculoskeletal: Normal range of motion.       Back:  Neurological: He is alert and oriented to person, place, and time. No cranial nerve deficit. Coordination normal.  Skin: Skin is warm and dry. He is not diaphoretic.  Psychiatric: He has a normal mood and affect.  Nursing note and vitals reviewed.   No results found for this or any previous visit (from the past 72 hour(s)).  Dg Chest 2 View  03/15/2016  CLINICAL DATA:  65 year old male with right side pleuritic pain, rhomboid muscle tenderness. Denies shortness of breath. Initial encounter. EXAM: CHEST  2 VIEW COMPARISON:  02/23/2010 FINDINGS: Lung volumes are stable and within normal limits. Normal cardiac size and mediastinal contours. Visualized tracheal air column is within normal limits. No pneumothorax, pulmonary edema, pleural effusion or abnormal pulmonary opacity. No acute osseous abnormality identified. Stable mild lower thoracic spine endplate spurring and mild dextro convex curvature. IMPRESSION: Stable and largely unremarkable radiographic appearance of the chest since 02/23/2010. Electronically Signed   By: Genevie Ann M.D.   On: 03/15/2016 08:52    ASSESSMENT AND PLAN  Ethan Lee was seen today for scapular pain and shortness of breath.  Diagnoses and all orders for this visit:  Rhomboid muscle strain, initial encounter -     DG Chest 2 View; Future -  TSH  Hypothyroidism, unspecified hypothyroidism type -     cyclobenzaprine (FLEXERIL) 10 MG tablet; Take 0.5-1 tablets (5-10 mg total) by mouth 3 (three) times daily as needed for muscle spasms.  Other orders -     Discontinue: cyclobenzaprine (FLEXERIL) 10 MG tablet; Take 0.5-1 tablets (5-10 mg total) by mouth 3 (three) times daily as needed for muscle spasms.    The patient was  advised to call or return to clinic if he does not see an improvement in symptoms or to seek the care of the closest emergency department if he worsens with the above plan.   Philis Fendt, MHS, PA-C [facility] 03/15/2016 8:58 AM

## 2016-03-15 NOTE — Addendum Note (Signed)
Addended by: Burnis Kingfisher on: 03/15/2016 09:12 AM   Modules accepted: Orders

## 2016-03-15 NOTE — Patient Instructions (Signed)
     IF you received an x-ray today, you will receive an invoice from [facility]. Please contact [facility] at [phone] with questions or concerns regarding your invoice.   IF you received labwork today, you will receive an invoice from [facility]. Please contact [facility] at [phone] with questions or concerns regarding your invoice.   Our billing staff will not be able to assist you with questions regarding bills from these companies.  You will be contacted with the lab results as soon as they are available. The fastest way to get your results is to activate your My Chart account. Instructions are located on the last page of this paperwork. If you have not heard from us regarding the results in 2 weeks, please contact this office.      

## 2016-03-16 LAB — SPECIMEN STATUS

## 2016-03-16 LAB — SPECIMEN STATUS REPORT

## 2016-03-17 ENCOUNTER — Other Ambulatory Visit: Payer: Self-pay | Admitting: *Deleted

## 2016-03-17 DIAGNOSIS — R7989 Other specified abnormal findings of blood chemistry: Secondary | ICD-10-CM

## 2016-03-19 LAB — TSH: TSH: 4.36 u[IU]/mL (ref 0.450–4.500)

## 2016-05-08 ENCOUNTER — Ambulatory Visit (INDEPENDENT_AMBULATORY_CARE_PROVIDER_SITE_OTHER): Payer: 59 | Admitting: Physician Assistant

## 2016-05-08 VITALS — BP 112/74 | HR 63 | Temp 97.7°F | Resp 16 | Ht 74.0 in | Wt 199.0 lb

## 2016-05-08 DIAGNOSIS — Z23 Encounter for immunization: Secondary | ICD-10-CM

## 2016-05-08 NOTE — Patient Instructions (Signed)
     IF you received an x-ray today, you will receive an invoice from [facility]. Please contact [facility] at [phone] with questions or concerns regarding your invoice.   IF you received labwork today, you will receive an invoice from [facility]. Please contact [facility] at [phone] with questions or concerns regarding your invoice.   Our billing staff will not be able to assist you with questions regarding bills from these companies.  You will be contacted with the lab results as soon as they are available. The fastest way to get your results is to activate your My Chart account. Instructions are located on the last page of this paperwork. If you have not heard from us regarding the results in 2 weeks, please contact this office.      

## 2016-05-08 NOTE — Progress Notes (Signed)
   05/08/2016 6:00 PM   DOB: 04-Mar-1951 / MRN: [id number]  SUBJECTIVE:  Ethan Lee is a 65 y.o. male presenting for a tdap.  Reports he sister in [place] pediatrician recommended the family all get vaccinated as there is a low birth weight infant being born into the family planned for this weekend. His last tdap is current however the pediatrician recommends the family all be vaccinated within six months of the infant's birth.    Immunization History  Administered Date(s) Administered  . Influenza Split 08/20/2013, 08/09/2015  . Pneumococcal Polysaccharide-23 04/23/2011  . Tdap 04/23/2011, 05/08/2016  . 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   He  has a past medical history of Allergy.    He  reports that he has never smoked. He has never used smokeless tobacco. He reports that he drinks about 1.8 oz of alcohol per week . He reports that he does not use drugs. He  reports that he currently engages in sexual activity. The patient  has a past surgical history that includes Knee surgery; Vocal cord polyps; and Tonsillectomy and adenoidectomy.  His family history includes Cancer in his brother, brother, and mother.  Review of Systems  Constitutional: Negative for chills and fever.  Eyes: Negative for blurred vision.  Respiratory: Negative for cough and shortness of breath.   Cardiovascular: Negative for chest pain.  Gastrointestinal: Negative for abdominal pain and nausea.  Genitourinary: Negative for dysuria, frequency and urgency.  Musculoskeletal: Negative for myalgias.  Skin: Negative for rash.  Neurological: Negative for dizziness, tingling and headaches.  Psychiatric/Behavioral: Negative for depression. The patient is not nervous/anxious.     The problem list and medications were reviewed and updated by myself where necessary and exist elsewhere in the encounter.   OBJECTIVE:  BP 112/74 (BP Location: Right Arm, Patient Position:  Sitting, Cuff Size: Normal)   Pulse 63   Temp 97.7 F (36.5 C)   Resp 16   Ht 6\' 2"  (1.88 m)   Wt 199 lb (90.3 kg)   SpO2 99%   BMI 25.55 kg/m   Physical Exam  Cardiovascular: Normal rate and regular rhythm.   Vitals reviewed.   No results found for this or any previous visit (from the past 72 hour(s)).  No results found.  ASSESSMENT AND PLAN  Ethan Lee was seen today for immunizations.  Diagnoses and all orders for this visit:  Need for Tdap vaccination: See HPI for discussion.  -     Tdap vaccine greater than or equal to 7yo IM    The patient is advised to call or return to clinic if he does not see an improvement in symptoms, or to seek the care of the closest emergency department if he worsens with the above plan.   Ethan Lee, MHS, PA-C [facility] 05/08/2016 6:00 PM

## 2016-07-26 ENCOUNTER — Ambulatory Visit (INDEPENDENT_AMBULATORY_CARE_PROVIDER_SITE_OTHER): Payer: 59 | Admitting: Physician Assistant

## 2016-07-26 VITALS — BP 132/90 | HR 74 | Temp 98.1°F | Resp 17 | Ht 74.0 in | Wt 198.0 lb

## 2016-07-26 DIAGNOSIS — Z1389 Encounter for screening for other disorder: Secondary | ICD-10-CM | POA: Diagnosis not present

## 2016-07-26 DIAGNOSIS — Z0000 Encounter for general adult medical examination without abnormal findings: Secondary | ICD-10-CM | POA: Diagnosis not present

## 2016-07-26 DIAGNOSIS — Z131 Encounter for screening for diabetes mellitus: Secondary | ICD-10-CM | POA: Diagnosis not present

## 2016-07-26 DIAGNOSIS — Z1329 Encounter for screening for other suspected endocrine disorder: Secondary | ICD-10-CM

## 2016-07-26 DIAGNOSIS — Z13220 Encounter for screening for lipoid disorders: Secondary | ICD-10-CM

## 2016-07-26 DIAGNOSIS — D229 Melanocytic nevi, unspecified: Secondary | ICD-10-CM

## 2016-07-26 DIAGNOSIS — Z23 Encounter for immunization: Secondary | ICD-10-CM

## 2016-07-26 DIAGNOSIS — Z130 Encounter for screening for diseases of the blood and blood-forming organs and certain disorders involving the immune mechanism: Secondary | ICD-10-CM | POA: Diagnosis not present

## 2016-07-26 DIAGNOSIS — Z114 Encounter for screening for human immunodeficiency virus [HIV]: Secondary | ICD-10-CM

## 2016-07-26 LAB — POCT URINALYSIS DIP (MANUAL ENTRY)
Bilirubin, UA: NEGATIVE
Blood, UA: NEGATIVE
Glucose, UA: NEGATIVE
Ketones, POC UA: NEGATIVE
LEUKOCYTES UA: NEGATIVE
NITRITE UA: NEGATIVE
PH UA: 5.5
PROTEIN UA: NEGATIVE
Spec Grav, UA: 1.015
UROBILINOGEN UA: 0.2

## 2016-07-26 NOTE — Patient Instructions (Signed)
     IF you received an x-ray today, you will receive an invoice from [facility]. Please contact  Radiology at [phone] with questions or concerns regarding your invoice.   IF you received labwork today, you will receive an invoice from [facility]. Please contact [facility] at [phone] with questions or concerns regarding your invoice.   Our billing staff will not be able to assist you with questions regarding bills from these companies.  You will be contacted with the lab results as soon as they are available. The fastest way to get your results is to activate your My Chart account. Instructions are located on the last page of this paperwork. If you have not heard from us regarding the results in 2 weeks, please contact this office.      

## 2016-07-26 NOTE — Progress Notes (Addendum)
By signing my name below, I, Ethan Lee, attest that this documentation has been prepared under the direction and in the presence of Ethan Lee, [place].  Electronically Signed: Verlee Lee, Medical Scribe. 07/26/16. 8:26 AM.  07/26/2016 8:48 AM   DOB: 1950-10-12 / MRN: [id number]  SUBJECTIVE:  Ethan Lee is a 65 y.o. male presenting for his annual physical.  Cancer Screening: Prostate CA: See PSA below. No FHx of prostate CA. Colon CA: Colonoscopy in 2012; repeat in 10 years. Pt has a FHx of colon CA; twin sister had nonmalignant colon CA. Skin CA: Pt reports mole on his abdomen on his left upper chest that he's no concerned and states it been there for 20 years   Exercise: Pt walks 1.5 miles with his dogs daily which takes 30 mins, and on the weekends he walks longer. Pt states he doesn't exercise much in the summer.  Smoking/Drinking/Elicit Drugs: Pt drinks Fri-Sat 1-2 glasses a wine a day. Pt does not smoke tobacco or do elicit drugs. Pt used to smoke when he was younger.  HepC/HIV Screening: Hep C screening in 2006 was negative  Hypothyroidism: Pt denies experiencing any negative side effects from his thyroid medication.  Immunizations: Pt would like to get his flu shot today. Pt had a recommendation for his pneumo vaccine   Depression Screening: Depression screen [facility] 2/9 07/26/2016 05/08/2016 03/15/2016 10/12/2015 09/11/2015  Decreased Interest 0 0 0 0 0  Down, Depressed, Hopeless 0 0 0 0 0  PHQ - 2 Score 0 0 0 0 0   Fall Screening: Fall Risk  07/26/2016 05/08/2016 03/15/2016 10/12/2015 07/26/2015  Falls in the past year? No No No No No   He is allergic to peanuts [peanut oil] and shellfish allergy.   He  has a past medical history of Allergy.    He  reports that he has never smoked. He has never used smokeless tobacco. He reports that he drinks about 1.8 oz of alcohol per week . He reports that he does not use drugs. He  reports that he currently engages in sexual  activity. The patient  has a past surgical history that includes Knee surgery; Vocal cord polyps; and Tonsillectomy and adenoidectomy.  His family history includes Cancer in his brother, brother, and mother.  Review of Systems  Constitutional: Negative for chills and fever.  Respiratory: Negative for cough.   Cardiovascular: Negative for chest pain.  Neurological: Negative for dizziness.  Psychiatric/Behavioral: Negative for depression and suicidal ideas. The patient is not nervous/anxious.     The problem list and medications were reviewed and updated by myself where necessary and exist elsewhere in the encounter.   OBJECTIVE:  BP 132/90 (BP Location: Left Arm, Patient Position: Sitting, Cuff Size: Normal)    Pulse 74    Temp 98.1 F (36.7 C) (Oral)    Resp 17    Ht 6\' 2"  (1.88 m)    Wt 198 lb (89.8 kg)    SpO2 99%    BMI 25.42 kg/m   Lab Results  Component Value Date   PSA 1.31 07/26/2015   PSA 1.4 07/20/2014   PSA 0.9 07/06/2013   Immunization History  Administered Date(s) Administered   Influenza Split 08/20/2013, 08/09/2015   Pneumococcal Polysaccharide-23 04/23/2011   Tdap 04/23/2011, 05/08/2016   Zoster 07/01/2012    Physical Exam  Constitutional: He appears well-developed and well-nourished. No distress.  HENT:  Head: Normocephalic and atraumatic.  Right Ear: Tympanic membrane and ear canal normal.  Left  Ear: Tympanic membrane and ear canal normal.  Mouth/Throat: Oropharynx is clear and moist.  Front denotation good  Eyes: Conjunctivae are normal.  Neck: Neck supple. No thyromegaly present.  Thyroid non palpable  Cardiovascular: Normal rate, regular rhythm and normal heart sounds.  Exam reveals no gallop and no friction rub.   No murmur heard. Pulmonary/Chest: Effort normal and breath sounds normal. No respiratory distress. He has no decreased breath sounds. He has no wheezes. He has no rales.  Clear to auscultation bilterally  Abdominal: Soft. Bowel sounds  are normal. There is no tenderness. There is no rebound, no guarding, no CVA tenderness and negative Murphy's sign.  Musculoskeletal: He exhibits no edema (lower extremity).  Lymphadenopathy:    He has no cervical adenopathy.  Neurological: He is alert.  Reflex Scores:      Tricep reflexes are 2+ on the right side and 2+ on the left side.      Bicep reflexes are 2+ on the right side and 2+ on the left side.      Brachioradialis reflexes are 2+ on the right side and 2+ on the left side.      Patellar reflexes are 2+ on the right side and 2+ on the left side.      Achilles reflexes are 2+ on the right side and 2+ on the left side. Skin: Skin is warm and dry.  Psychiatric: He has a normal mood and affect. His behavior is normal.  Nursing note and vitals reviewed.    No results found for this or any previous visit (from the past 72 hour(s)).  No results found.  ASSESSMENT AND PLAN  Ethan Lee was seen today for annual exam and flu vaccine.  Diagnoses and all orders for this visit:  Annual physical exam: Healthy male exam.  Filling in some gaps in care.  Flu and pneumococcal today.   Need for prophylactic vaccination against Streptococcus pneumoniae (pneumococcus) -     Pneumococcal polysaccharide vaccine 23-valent greater than or equal to 2yo subcutaneous/IM  Needs flu shot -     Flu Vaccine QUAD 36+ mos IM  Screening for deficiency anemia -     CBC  Screening for nephropathy -     COMPLETE METABOLIC PANEL WITH GFR -     POCT urinalysis dipstick  Screening for lipid disorders -     Lipid panel  Screening for thyroid disorder -     TSH  Screening for diabetes mellitus -     Hemoglobin A1c  Screening for HIV (human immunodeficiency virus) -     HIV antibody  Suspicious nevus: See photo.  The lesion is small and not changing however the mole has some abnormal features.   -     Ambulatory referral to Dermatology  This note was scribed in my presence and I performed the  services described in the this documentation.    The patient is advised to call or return to clinic if he does not see an improvement in symptoms, or to seek the care of the closest emergency department if he worsens with the above plan.   Ethan Lee, MHS, PA-C [facility] 07/26/2016 8:48 AM

## 2016-07-27 LAB — COMPREHENSIVE METABOLIC PANEL
ALK PHOS: 58 IU/L (ref 39–117)
ALT: 13 IU/L (ref 0–44)
AST: 17 IU/L (ref 0–40)
Albumin/Globulin Ratio: 2 (ref 1.2–2.2)
Albumin: 4.7 g/dL (ref 3.6–4.8)
BILIRUBIN TOTAL: 0.5 mg/dL (ref 0.0–1.2)
BUN/Creatinine Ratio: 19 (ref 10–24)
BUN: 19 mg/dL (ref 8–27)
CHLORIDE: 99 mmol/L (ref 96–106)
CO2: 28 mmol/L (ref 18–29)
Calcium: 9.6 mg/dL (ref 8.6–10.2)
Creatinine, Ser: 0.99 mg/dL (ref 0.76–1.27)
GFR calc Af Amer: 92 mL/min/{1.73_m2} (ref >59)
GFR calc non Af Amer: 80 mL/min/{1.73_m2} (ref >59)
GLUCOSE: 96 mg/dL (ref 65–99)
Globulin, Total: 2.3 g/dL (ref 1.5–4.5)
Potassium: 4.6 mmol/L (ref 3.5–5.2)
Sodium: 140 mmol/L (ref 134–144)
Total Protein: 7 g/dL (ref 6.0–8.5)

## 2016-07-27 LAB — HEMOGLOBIN A1C
Est. average glucose Bld gHb Est-mCnc: 108 mg/dL
HEMOGLOBIN A1C: 5.4 % (ref 4.8–5.6)

## 2016-07-27 LAB — CBC WITH DIFFERENTIAL/PLATELET
BASOS ABS: 0 10*3/uL (ref 0.0–0.2)
Basos: 1 %
EOS (ABSOLUTE): 0.1 10*3/uL (ref 0.0–0.4)
Eos: 4 %
Hematocrit: 41.6 % (ref 37.5–51.0)
Hemoglobin: 14 g/dL (ref 12.6–17.7)
IMMATURE GRANS (ABS): 0 10*3/uL (ref 0.0–0.1)
Immature Granulocytes: 0 %
LYMPHS ABS: 0.9 10*3/uL (ref 0.7–3.1)
LYMPHS: 24 %
MCH: 28.7 pg (ref 26.6–33.0)
MCHC: 33.7 g/dL (ref 31.5–35.7)
MCV: 85 fL (ref 79–97)
Monocytes Absolute: 0.4 10*3/uL (ref 0.1–0.9)
Monocytes: 10 %
NEUTROS ABS: 2.4 10*3/uL (ref 1.4–7.0)
Neutrophils: 61 %
PLATELETS: 172 10*3/uL (ref 150–379)
RBC: 4.87 x10E6/uL (ref 4.14–5.80)
RDW: 13.1 % (ref 12.3–15.4)
WBC: 3.9 10*3/uL (ref 3.4–10.8)

## 2016-07-27 LAB — LIPID PANEL
CHOLESTEROL TOTAL: 178 mg/dL (ref 100–199)
Chol/HDL Ratio: 3.8 ratio units (ref 0.0–5.0)
HDL: 47 mg/dL (ref >39)
LDL Calculated: 117 mg/dL — ABNORMAL HIGH (ref 0–99)
Triglycerides: 69 mg/dL (ref 0–149)
VLDL CHOLESTEROL CAL: 14 mg/dL (ref 5–40)

## 2016-07-27 LAB — HIV ANTIBODY (ROUTINE TESTING W REFLEX): HIV Screen 4th Generation wRfx: NONREACTIVE

## 2016-07-27 LAB — TSH: TSH: 4.07 u[IU]/mL (ref 0.450–4.500)

## 2016-10-31 ENCOUNTER — Other Ambulatory Visit: Payer: Self-pay | Admitting: Family Medicine

## 2016-10-31 DIAGNOSIS — E063 Autoimmune thyroiditis: Secondary | ICD-10-CM

## 2017-01-10 ENCOUNTER — Ambulatory Visit (INDEPENDENT_AMBULATORY_CARE_PROVIDER_SITE_OTHER): Payer: 59 | Admitting: Physician Assistant

## 2017-01-10 VITALS — BP 122/75 | HR 68 | Temp 97.6°F | Resp 16 | Ht 72.5 in | Wt 194.8 lb

## 2017-01-10 DIAGNOSIS — I499 Cardiac arrhythmia, unspecified: Secondary | ICD-10-CM

## 2017-01-10 DIAGNOSIS — Z8679 Personal history of other diseases of the circulatory system: Secondary | ICD-10-CM

## 2017-01-10 NOTE — Progress Notes (Signed)
Ethan Lee  MRN: [id number] DOB: 04/30/1951  PCP: Kathlen Brunswick, PA-C  Subjective:  Pt is a pleasant 66 year old male PMH thyroid activity decrease who presents to clinic for check-up on his heart. He was going to give blood this morning, but the check-in staff member told him he has an irregular heart beat. Denies history of irregular heart beat or atrial fibrillation.  He denies palpitations, sweating, syncope, pre-syncope, chest pain, headache, dizziness, neck or arm discomfort, orthopnea, edema.  Endorses some mild fatigue over the past 6 months or so - however reviewing his chart, it appears he has c/o fatigue for several years.   H/o mitral valve prolapse - evaluated by cardiology 07/2012 after Dr. Elder Cyphers detected a mid-systolic click. Echocardiogram in 2013. No treatment.   Exercise: Pt walks 1.5 miles with his dogs daily which takes 30 mins, and on the weekends he walks longer. Pt states he doesn't exercise much in the summer.  Smoking/Drinking/Elicit Drugs: Pt drinks Fri-Sat 1-2 glasses a wine a day. Pt does not smoke tobacco or do elicit drugs. Pt used to smoke when he was younger.  Review of Systems  Constitutional: Positive for fatigue. Negative for chills and diaphoresis.  Respiratory: Negative for cough, chest tightness, shortness of breath and wheezing.   Cardiovascular: Negative for chest pain, palpitations and leg swelling.  Gastrointestinal: Negative for diarrhea, nausea and vomiting.  Musculoskeletal: Negative for neck pain.  Neurological: Negative for dizziness, syncope, light-headedness and headaches.  Psychiatric/Behavioral: Negative for sleep disturbance. The patient is not nervous/anxious.     Patient Active Problem List   Diagnosis Date Noted  . Thyroid activity decreased 03/15/2016  . Onychomycosis 09/08/2013  . Abnormal heart sounds 07/15/2012  . Allergy history, peanuts 06/30/2012  . Allergy history unknown 06/30/2012    Current Outpatient  Prescriptions on File Prior to Visit  Medication Sig Dispense Refill  . aspirin 81 MG tablet Take 81 mg by mouth daily.    . cetirizine (ZYRTEC) 10 MG tablet Take 10 mg by mouth every evening.    . cholecalciferol (VITAMIN D) 1000 UNITS tablet Take 1,000 Units by mouth daily.    Marland Kitchen levothyroxine (SYNTHROID, LEVOTHROID) 25 MCG tablet TAKE 1 TABLET BY MOUTH  DAILY BEFORE BREAKFAST 90 tablet 3  . loratadine (CLARITIN) 10 MG tablet Take 10 mg by mouth every morning.    . vitamin B-12 (CYANOCOBALAMIN) 100 MCG tablet Take 100 mcg by mouth daily.    . vitamin C (ASCORBIC ACID) 500 MG tablet Take 500 mg by mouth daily. Two tabs    . vitamin E 400 UNIT capsule Take 400 Units by mouth daily.    . cyclobenzaprine (FLEXERIL) 10 MG tablet Take 0.5-1 tablets (5-10 mg total) by mouth 3 (three) times daily as needed for muscle spasms. (Patient not taking: Reported on 07/26/2016) 30 tablet 0   No current facility-administered medications on file prior to visit.     Allergies  Allergen Reactions  . Peanuts [Peanut Oil]   . Shellfish Allergy      Objective:  BP 122/75   Pulse 68   Temp 97.6 F (36.4 C) (Oral)   Resp 16   Ht 6' 0.5" (1.842 m)   Wt 194 lb 12.8 oz (88.4 kg)   SpO2 98%   BMI 26.06 kg/m   Physical Exam  Constitutional: He is oriented to person, place, and time and well-developed, well-nourished, and in no distress. No distress.  Neck: Normal carotid pulses and no JVD present. Carotid bruit  is not present. No thyroid mass and no thyromegaly present.  Cardiovascular: Normal rate, regular rhythm, S1 normal, S2 normal, normal heart sounds and normal pulses.   No extrasystoles are present.  Neurological: He is alert and oriented to person, place, and time. GCS score is 15.  Skin: Skin is warm and dry.  Psychiatric: Mood, memory, affect and judgment normal.  Vitals reviewed.   EKG - unchanged from 2014. Sinus rhythm. No acute ST-T wave changes.   07/2012 echocardiogram: Study  Conclusions - Left ventricle: The cavity size was normal. Systolic function was normal. The estimated ejection fraction was in the range of 55% to 60%. Wall motion was normal; there were no regional wall motion abnormalities. - Aortic valve: Trivial regurgitation. - Atrial septum: No defect or patent foramen ovale was identified.  Assessment and Plan :  This case was discussed with Dr. Mitchel Honour.   1. Irregular heart beat 2. History of mitral valve prolapse - EKG 12-Lead - CBC with Differential/Platelet - TSH - CMP14+EGFR - Ambulatory referral to Cardiology - Labs are pending. EKG unchanged from previous tracing. Echo 2013 normal EF, trivial aortic regurgitation. No concern at this time for new or acute cardiac abnormality, however will refer to cardiology for work-up of single episode of "irregular heart beat" noted earlier today. He understands and agrees with plan.   Mercer Pod, PA-C  [facility] 01/10/2017 5:11 PM

## 2017-01-10 NOTE — Patient Instructions (Addendum)
There is no concern for an acute process involving your heart today.  Your EKG is unchanged from 2014.  Your labs are pending - You will be contacted with the results.  I referred you to cardiology, they will call you to schedule an appointment.    Thank you for coming in today. I hope you feel we met your needs.  Feel free to call [facility] if you have any questions or further requests.  Please consider signing up for MyChart if you do not already have it, as this is a great way to communicate with me.  Best,  Whitney McVey, PA-C  IF you received an x-ray today, you will receive an invoice from [facility]. Please contact [facility] at [phone] with questions or concerns regarding your invoice.   IF you received labwork today, you will receive an invoice from [facility]. Please contact [facility] at [phone] with questions or concerns regarding your invoice.   Our billing staff will not be able to assist you with questions regarding bills from these companies.  You will be contacted with the lab results as soon as they are available. The fastest way to get your results is to activate your My Chart account. Instructions are located on the last page of this paperwork. If you have not heard from [place] regarding the results in 2 weeks, please contact this office.

## 2017-01-11 LAB — CBC WITH DIFFERENTIAL/PLATELET
Basophils Absolute: 0 10*3/uL (ref 0.0–0.2)
Basos: 0 %
EOS (ABSOLUTE): 0.2 10*3/uL (ref 0.0–0.4)
Eos: 4 %
Hematocrit: 40 % (ref 37.5–51.0)
Hemoglobin: 13.9 g/dL (ref 13.0–17.7)
Immature Grans (Abs): 0 10*3/uL (ref 0.0–0.1)
Immature Granulocytes: 0 %
Lymphocytes Absolute: 1.6 10*3/uL (ref 0.7–3.1)
Lymphs: 38 %
MCH: 28.5 pg (ref 26.6–33.0)
MCHC: 34.8 g/dL (ref 31.5–35.7)
MCV: 82 fL (ref 79–97)
Monocytes Absolute: 0.3 10*3/uL (ref 0.1–0.9)
Monocytes: 8 %
Neutrophils Absolute: 2.2 10*3/uL (ref 1.4–7.0)
Neutrophils: 50 %
Platelets: 177 10*3/uL (ref 150–379)
RBC: 4.88 x10E6/uL (ref 4.14–5.80)
RDW: 13 % (ref 12.3–15.4)
WBC: 4.3 10*3/uL (ref 3.4–10.8)

## 2017-01-11 LAB — CMP14+EGFR
ALT: 13 IU/L (ref 0–44)
AST: 17 IU/L (ref 0–40)
Albumin/Globulin Ratio: 2 (ref 1.2–2.2)
Albumin: 4.5 g/dL (ref 3.6–4.8)
Alkaline Phosphatase: 60 IU/L (ref 39–117)
BUN/Creatinine Ratio: 14 (ref 10–24)
BUN: 14 mg/dL (ref 8–27)
Bilirubin Total: 0.4 mg/dL (ref 0.0–1.2)
CO2: 24 mmol/L (ref 18–29)
Calcium: 9.2 mg/dL (ref 8.6–10.2)
Chloride: 102 mmol/L (ref 96–106)
Creatinine, Ser: 1.02 mg/dL (ref 0.76–1.27)
GFR calc Af Amer: 89 mL/min/{1.73_m2} (ref >59)
GFR calc non Af Amer: 77 mL/min/{1.73_m2} (ref >59)
Globulin, Total: 2.2 g/dL (ref 1.5–4.5)
Glucose: 87 mg/dL (ref 65–99)
Potassium: 4.3 mmol/L (ref 3.5–5.2)
Sodium: 141 mmol/L (ref 134–144)
Total Protein: 6.7 g/dL (ref 6.0–8.5)

## 2017-01-11 LAB — TSH: TSH: 4.1 u[IU]/mL (ref 0.450–4.500)

## 2017-01-17 ENCOUNTER — Encounter: Payer: Self-pay | Admitting: Cardiology

## 2017-02-05 NOTE — Progress Notes (Signed)
Cardiology Office Note   Date:  02/07/2017   ID:  Ethan Lee, DOB Oct 11, 1950, MRN [id number]  PCP:  Tereasa Coop, PA-C  Cardiologist:   Minus Breeding, MD  Referring:  Desiree Lucy*   Chief Complaint  Patient presents with  . Palpitations      History of Present Illness: Ethan Lee is a 66 y.o. male who presents for evaluation of palpitations.  I saw him in 2013 for evaluation of possible mitral valve click.  There were no abnormalities on the echo except mild aortic root dilatation.   He is referred back for evaluation of palpitations.   He does not actually feel these.   They were noted only when he was trying to give blood and he was noted to have ectopy.  The patient denies any new symptoms such as chest discomfort, neck or arm discomfort. There has been no new shortness of breath, PND or orthopnea. There have been no reported palpitations, presyncope or syncope.  He has stress at work.  He walks his dogs.  None of this brings on cardiovascular symptoms.    Past Medical History:  Diagnosis Date  . Hypothyroid     Past Surgical History:  Procedure Laterality Date  . KNEE SURGERY     x 4  . TONSILLECTOMY AND ADENOIDECTOMY    . Vocal cord polyps       Current Outpatient Prescriptions  Medication Sig Dispense Refill  . aspirin 81 MG tablet Take 81 mg by mouth daily.    . cetirizine (ZYRTEC) 10 MG tablet Take 10 mg by mouth every evening.    . cholecalciferol (VITAMIN D) 1000 UNITS tablet Take 1,000 Units by mouth daily.    Marland Kitchen levothyroxine (SYNTHROID, LEVOTHROID) 25 MCG tablet TAKE 1 TABLET BY MOUTH  DAILY BEFORE BREAKFAST 90 tablet 3  . loratadine (CLARITIN) 10 MG tablet Take 10 mg by mouth every morning.    . vitamin B-12 (CYANOCOBALAMIN) 100 MCG tablet Take 100 mcg by mouth daily.    . vitamin C (ASCORBIC ACID) 500 MG tablet Take 500 mg by mouth daily. Two tabs    . vitamin E 400 UNIT capsule Take 400 Units by mouth daily.     No current  facility-administered medications for this visit.     Allergies:   Peanuts [peanut oil] and Shellfish allergy    Social History:  The patient  reports that he has never smoked. He has never used smokeless tobacco. He reports that he drinks about 1.8 oz of alcohol per week . He reports that he does not use drugs.   Family History:  The patient's family history includes Cancer in his brother, brother, and mother; Leukemia in his father.   ROS:  Please see the history of present illness.   Otherwise, review of systems are positive for none.   All other systems are reviewed and negative.    PHYSICAL EXAM: VS:  BP 128/90 (BP Location: Right Arm, Patient Position: Sitting, Cuff Size: Normal)   Pulse 60   Ht 6\' 1"  (1.854 m)   Wt 195 lb (88.5 kg)   BMI 25.73 kg/m  , BMI Body mass index is 25.73 kg/m. GENERAL:  Well appearing HEENT:  Pupils equal round and reactive, fundi not visualized, oral mucosa unremarkable NECK:  No jugular venous distention, waveform within normal limits, carotid upstroke brisk and symmetric, no bruits, no thyromegaly LYMPHATICS:  No cervical, inguinal adenopathy LUNGS:  Clear to auscultation bilaterally BACK:  No  CVA tenderness CHEST:  Unremarkable HEART:  PMI not displaced or sustained,S1 and S2 within normal limits, no S3, no S4, no clicks, no rubs, no murmurs ABD:  Flat, positive bowel sounds normal in frequency in pitch, no bruits, no rebound, no guarding, no midline pulsatile mass, no hepatomegaly, no splenomegaly EXT:  2 plus pulses throughout, no edema, no cyanosis no clubbing SKIN:  No rashes no nodules NEURO:  Cranial nerves II through XII grossly intact, motor grossly intact throughout PSYCH:  Cognitively intact, oriented to person place and time   EKG:  EKG is not ordered today. The ekg ordered 01/10/17 demonstrates  sinus rhythm, rate 64, left axis deviation, poor anterior R wave progression, borderline first-degree AV block, no acute ST-T wave  changes.  There is no change compared to previous.    Recent Labs: 01/10/2017: ALT 13; BUN 14; Creatinine, Ser 1.02; Platelets 177; Potassium 4.3; Sodium 141; TSH 4.100    Lipid Panel    Component Value Date/Time   CHOL 178 07/26/2016 0902   TRIG 69 07/26/2016 0902   HDL 47 07/26/2016 0902   CHOLHDL 3.8 07/26/2016 0902   CHOLHDL 4.0 07/26/2015 0852   VLDL 20 07/26/2015 0852   LDLCALC 117 (H) 07/26/2016 0902      Wt Readings from Last 3 Encounters:  02/06/17 195 lb (88.5 kg)  01/10/17 194 lb 12.8 oz (88.4 kg)  07/26/16 198 lb (89.8 kg)      Other studies Reviewed: Additional studies/ records that were reviewed today include: Office records. Review of the above records demonstrates:  Please see elsewhere in the note.     ASSESSMENT AND PLAN:   ASCENDING AORTIC ENLARGEMENT:  This was mild and no follow up is indicated at this point.  PALPITATIONS:  He is not feeling this. This point no further cardiovascular testing is indicated. He had normal left grams. He has no risk factors. It is very unlikely we will find anything on a monitor that would require further treatment since he is asymptomatic. I did review recent blood work in his thyroid and electrolytes were unremarkable.  We had a long discussion about this.     Current medicines are reviewed at length with the patient today.  The patient does not have concerns regarding medicines.  The following changes have been made:  no change  Labs/ tests ordered today include:  No orders of the defined types were placed in this encounter.    Disposition:   FU with me as needed.     Signed, Minus Breeding, MD  02/07/2017 1:07 PM    [place]

## 2017-02-06 ENCOUNTER — Encounter: Payer: Self-pay | Admitting: Cardiology

## 2017-02-06 ENCOUNTER — Ambulatory Visit (INDEPENDENT_AMBULATORY_CARE_PROVIDER_SITE_OTHER): Payer: 59 | Admitting: Cardiology

## 2017-02-06 VITALS — BP 128/90 | HR 60 | Ht 73.0 in | Wt 195.0 lb

## 2017-02-06 DIAGNOSIS — R002 Palpitations: Secondary | ICD-10-CM

## 2017-02-06 NOTE — Patient Instructions (Signed)
Medication Instructions:  Continue current medications  Labwork: None Ordered  Testing/Procedures: None Ordered  Follow-Up: Your physician recommends that you schedule a follow-up appointment in: As Needed   Any Other Special Instructions Will Be Listed Below (If Applicable).   If you need a refill on your cardiac medications before your next appointment, please call your pharmacy.   

## 2017-02-07 ENCOUNTER — Encounter: Payer: Self-pay | Admitting: Cardiology

## 2017-07-29 ENCOUNTER — Ambulatory Visit (INDEPENDENT_AMBULATORY_CARE_PROVIDER_SITE_OTHER): Payer: 59 | Admitting: Physician Assistant

## 2017-07-29 ENCOUNTER — Encounter: Payer: Self-pay | Admitting: Physician Assistant

## 2017-07-29 VITALS — BP 122/70 | HR 61 | Temp 97.5°F | Resp 16 | Ht 72.13 in | Wt 191.6 lb

## 2017-07-29 DIAGNOSIS — Z130 Encounter for screening for diseases of the blood and blood-forming organs and certain disorders involving the immune mechanism: Secondary | ICD-10-CM

## 2017-07-29 DIAGNOSIS — Z0000 Encounter for general adult medical examination without abnormal findings: Secondary | ICD-10-CM | POA: Diagnosis not present

## 2017-07-29 DIAGNOSIS — Z1321 Encounter for screening for nutritional disorder: Secondary | ICD-10-CM

## 2017-07-29 DIAGNOSIS — Z1329 Encounter for screening for other suspected endocrine disorder: Secondary | ICD-10-CM

## 2017-07-29 DIAGNOSIS — Z13228 Encounter for screening for other metabolic disorders: Secondary | ICD-10-CM | POA: Diagnosis not present

## 2017-07-29 LAB — POCT CBC
GRANULOCYTE PERCENT: 58.7 % (ref 37–80)
HCT, POC: 39.7 % — AB (ref 43.5–53.7)
HEMOGLOBIN: 13.4 g/dL — AB (ref 14.1–18.1)
LYMPH, POC: 1.1 (ref 0.6–3.4)
MCH: 29 pg (ref 27–31.2)
MCHC: 33.7 g/dL (ref 31.8–35.4)
MCV: 86.2 fL (ref 80–97)
MID (cbc): 0.4 (ref 0–0.9)
MPV: 8.1 fL (ref 0–99.8)
POC GRANULOCYTE: 2.1 (ref 2–6.9)
POC LYMPH PERCENT: 30.6 %L (ref 10–50)
POC MID %: 10.7 %M (ref 0–12)
Platelet Count, POC: 156 10*3/uL (ref 142–424)
RBC: 4.61 M/uL — AB (ref 4.69–6.13)
RDW, POC: 12.6 %
WBC: 3.5 10*3/uL — AB (ref 4.6–10.2)

## 2017-07-29 LAB — POCT GLYCOSYLATED HEMOGLOBIN (HGB A1C): HEMOGLOBIN A1C: 5.5

## 2017-07-29 NOTE — Progress Notes (Signed)
07/29/2017 2:04 PM   DOB: 1951/01/09 / MRN: [id number]  SUBJECTIVE:  Ethan Lee is a 66 y.o. male presenting for annual exam.  He needs a flu shot today. He feels well.  Was seen by cards a few months back for palpitations which were asymptomatic and noted while donating blood.  No testing or follow up was recommended by cards. Health maintenance shows that he is up to date with his care today. Takes zyrtec and claritin for a history of allergy.  Is followed by allergy.  He sleeps well, but not enough. Tells me his work is "alright" and has been very busy and he watches the computer screen too often. Exericising less due to the heat.  Is getting back to this with the weather cooling down.   Immunization History  Administered Date(s) Administered  . Influenza Split 08/20/2013, 08/09/2015  . Influenza,inj,Quad PF,6+ Mos 07/26/2016  . Pneumococcal Polysaccharide-23 04/23/2011, 07/26/2016  . Tdap 04/23/2011, 05/08/2016  . 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   He  has a past medical history of Hypothyroid.    He  reports that he has never smoked. He has never used smokeless tobacco. He reports that he drinks about 1.8 oz of alcohol per week . He reports that he does not use drugs. He  reports that he currently engages in sexual activity. The patient  has a past surgical history that includes Knee surgery; Vocal cord polyps; and Tonsillectomy and adenoidectomy.  His family history includes Cancer in his brother, brother, and mother; Leukemia in his father; Other in his father.  Review of Systems  Constitutional: Negative for chills, diaphoresis and fever.  Eyes: Negative.   Respiratory: Negative for cough, hemoptysis, sputum production, shortness of breath and wheezing.   Cardiovascular: Negative for chest pain, orthopnea and leg swelling.  Gastrointestinal: Negative for abdominal pain, blood in stool, constipation, diarrhea,  heartburn, melena, nausea and vomiting.  Genitourinary: Negative for flank pain.  Skin: Negative for rash.  Neurological: Negative for dizziness, sensory change, speech change, focal weakness and headaches.    The problem list and medications were reviewed and updated by myself where necessary and exist elsewhere in the encounter.   OBJECTIVE:  BP 122/70 (BP Location: Left Arm, Patient Position: Sitting, Cuff Size: Large)   Pulse 61   Temp (!) 97.5 F (36.4 C) (Oral)   Resp 16   Ht 6' 0.13" (1.832 m)   Wt 191 lb 9.6 oz (86.9 kg)   SpO2 98%   BMI 25.89 kg/m   Physical Exam  Constitutional: He is oriented to person, place, and time. He appears well-developed. He is active and cooperative.  Non-toxic appearance.  Eyes: Pupils are equal, round, and reactive to light. EOM are normal.  Cardiovascular: Normal rate, regular rhythm, S1 normal, S2 normal, normal heart sounds, intact distal pulses and normal pulses.  Exam reveals no gallop and no friction rub.   No murmur heard. Pulmonary/Chest: Effort normal. No stridor. No tachypnea. No respiratory distress. He has no wheezes. He has no rales.  Abdominal: He exhibits no distension.  Musculoskeletal: He exhibits no edema.  Neurological: He is alert and oriented to person, place, and time. He has normal strength and normal reflexes. He is not disoriented. No cranial nerve deficit or sensory deficit. He exhibits normal muscle tone. Coordination and gait normal.  Skin: Skin is warm and dry. He is not diaphoretic. No pallor.  Psychiatric: His behavior  is normal.  Vitals reviewed.    Lab Results  Component Value Date   PSA 1.31 07/26/2015   PSA 1.4 07/20/2014   PSA 0.9 07/06/2013   Lab Results  Component Value Date   WBC 3.5 (A) 07/29/2017   HGB 13.4 (A) 07/29/2017   HCT 39.7 (A) 07/29/2017   MCV 86.2 07/29/2017   PLT 177 01/10/2017    Lab Results  Component Value Date   CREATININE 1.02 01/10/2017   BUN 14 01/10/2017   NA 141  01/10/2017   K 4.3 01/10/2017   CL 102 01/10/2017   CO2 24 01/10/2017    Lab Results  Component Value Date   ALT 13 01/10/2017   AST 17 01/10/2017   ALKPHOS 60 01/10/2017   BILITOT 0.4 01/10/2017    Lab Results  Component Value Date   TSH 4.100 01/10/2017    Lab Results  Component Value Date   HGBA1C 5.5 07/29/2017    Lab Results  Component Value Date   CHOL 178 07/26/2016   HDL 47 07/26/2016   LDLCALC 117 (H) 07/26/2016   TRIG 69 07/26/2016   CHOLHDL 3.8 07/26/2016    No results found.  ASSESSMENT AND PLAN:  Ethan Lee was seen today for annual exam.  Diagnoses and all orders for this visit:  Annual physical exam:  He enjoys excellent health.  I have encouraged him to protect this by maintaining a daily planned exercise session.   Screening for endocrine, nutritional, metabolic and immunity disorder -     Comprehensive metabolic panel -     Lipid panel -     TSH -     PSA -     T4, Free -     POCT CBC -     POCT glycosylated hemoglobin (Hb A1C)  Other orders -     Cancel: CBC with Differential/Platelet -     Pneumococcal conjugate vaccine 13-valent    The patient is advised to call or return to clinic if he does not see an improvement in symptoms, or to seek the care of the closest emergency department if he worsens with the above plan.   Ethan Lee, MHS, PA-C [facility] 07/29/2017 2:04 PM

## 2017-07-29 NOTE — Patient Instructions (Signed)
     IF you received an x-ray today, you will receive an invoice from [facility]. Please contact [facility] at [phone] with questions or concerns regarding your invoice.   IF you received labwork today, you will receive an invoice from [facility]. Please contact [facility] at [phone] with questions or concerns regarding your invoice.   Our billing staff will not be able to assist you with questions regarding bills from these companies.  You will be contacted with the lab results as soon as they are available. The fastest way to get your results is to activate your My Chart account. Instructions are located on the last page of this paperwork. If you have not heard from us regarding the results in 2 weeks, please contact this office.     

## 2017-07-30 LAB — LIPID PANEL
Chol/HDL Ratio: 3.4 ratio (ref 0.0–5.0)
Cholesterol, Total: 167 mg/dL (ref 100–199)
HDL: 49 mg/dL (ref >39)
LDL Calculated: 104 mg/dL — ABNORMAL HIGH (ref 0–99)
TRIGLYCERIDES: 71 mg/dL (ref 0–149)
VLDL CHOLESTEROL CAL: 14 mg/dL (ref 5–40)

## 2017-07-30 LAB — COMPREHENSIVE METABOLIC PANEL
A/G RATIO: 2 (ref 1.2–2.2)
ALT: 13 IU/L (ref 0–44)
AST: 17 IU/L (ref 0–40)
Albumin: 4.5 g/dL (ref 3.6–4.8)
Alkaline Phosphatase: 60 IU/L (ref 39–117)
BUN/Creatinine Ratio: 14 (ref 10–24)
BUN: 15 mg/dL (ref 8–27)
Bilirubin Total: 0.6 mg/dL (ref 0.0–1.2)
CALCIUM: 9.6 mg/dL (ref 8.6–10.2)
CHLORIDE: 103 mmol/L (ref 96–106)
CO2: 26 mmol/L (ref 20–29)
Creatinine, Ser: 1.05 mg/dL (ref 0.76–1.27)
GFR calc Af Amer: 85 mL/min/{1.73_m2} (ref >59)
GFR calc non Af Amer: 74 mL/min/{1.73_m2} (ref >59)
GLUCOSE: 86 mg/dL (ref 65–99)
Globulin, Total: 2.3 g/dL (ref 1.5–4.5)
POTASSIUM: 4.3 mmol/L (ref 3.5–5.2)
Sodium: 143 mmol/L (ref 134–144)
Total Protein: 6.8 g/dL (ref 6.0–8.5)

## 2017-07-30 LAB — PSA: Prostate Specific Ag, Serum: 0.8 ng/mL (ref 0.0–4.0)

## 2017-07-30 LAB — TSH: TSH: 4.37 u[IU]/mL (ref 0.450–4.500)

## 2017-07-30 LAB — T4, FREE: Free T4: 1.08 ng/dL (ref 0.82–1.77)

## 2017-08-09 ENCOUNTER — Ambulatory Visit (INDEPENDENT_AMBULATORY_CARE_PROVIDER_SITE_OTHER): Payer: 59 | Admitting: Urgent Care

## 2017-08-09 DIAGNOSIS — Z23 Encounter for immunization: Secondary | ICD-10-CM

## 2017-08-09 NOTE — Progress Notes (Signed)
Spoke with patient, will obtain records and sign ROI

## 2017-10-02 ENCOUNTER — Other Ambulatory Visit: Payer: Self-pay | Admitting: Physician Assistant

## 2017-10-02 DIAGNOSIS — E063 Autoimmune thyroiditis: Secondary | ICD-10-CM

## 2018-01-15 ENCOUNTER — Ambulatory Visit: Payer: 59 | Admitting: Physician Assistant

## 2018-01-15 ENCOUNTER — Other Ambulatory Visit: Payer: Self-pay

## 2018-01-15 ENCOUNTER — Encounter: Payer: Self-pay | Admitting: Physician Assistant

## 2018-01-15 VITALS — BP 110/68 | HR 62 | Temp 97.8°F | Resp 18 | Ht 72.13 in | Wt 193.8 lb

## 2018-01-15 DIAGNOSIS — E063 Autoimmune thyroiditis: Secondary | ICD-10-CM | POA: Diagnosis not present

## 2018-01-15 DIAGNOSIS — R710 Precipitous drop in hematocrit: Secondary | ICD-10-CM

## 2018-01-15 DIAGNOSIS — E038 Other specified hypothyroidism: Secondary | ICD-10-CM | POA: Diagnosis not present

## 2018-01-15 MED ORDER — LEVOTHYROXINE SODIUM 25 MCG PO TABS: 25.0000 ug | ORAL_TABLET | Freq: Every day | ORAL | 3 refills | Status: DC

## 2018-01-15 NOTE — Progress Notes (Signed)
01/17/2018 11:15 AM   DOB: 1951-07-17 / MRN: [id number]  SUBJECTIVE:  Ethan Lee is a 67 y.o. male presenting for refills of levothyroxine.  Patient feels well today and denies complaint.  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   He  has a past medical history of Hypothyroid.    He  reports that he has never smoked. He has never used smokeless tobacco. He reports that he drinks about 1.8 oz of alcohol per week. He reports that he does not use drugs. He  reports that he currently engages in sexual activity. The patient  has a past surgical history that includes Knee surgery; Vocal cord polyps; and Tonsillectomy and adenoidectomy.  His family history includes Cancer in his brother, brother, and mother; Leukemia in his father; Other in his father.  Review of Systems  Constitutional: Negative for chills, diaphoresis and fever.  Gastrointestinal: Negative for nausea.  Skin: Negative for rash.  Neurological: Negative for dizziness.    The problem list and medications were reviewed and updated by myself where necessary and exist elsewhere in the encounter.   OBJECTIVE:  BP 110/68   Pulse 62   Temp 97.8 F (36.6 C) (Oral)   Resp 18   Ht 6' 0.13" (1.832 m)   Wt 193 lb 12.8 oz (87.9 kg)   SpO2 98%   BMI 26.19 kg/m   Physical Exam  Constitutional: He is oriented to person, place, and time. He appears well-developed. He does not appear ill.  Eyes: Pupils are equal, round, and reactive to light. Conjunctivae and EOM are normal.  Cardiovascular: Normal rate.  Pulmonary/Chest: Effort normal.  Abdominal: He exhibits no distension.  Musculoskeletal: Normal range of motion.  Neurological: He is alert and oriented to person, place, and time. No cranial nerve deficit. Coordination normal.  Skin: Skin is warm and dry. He is not diaphoretic.  Psychiatric: He has a normal mood and affect.  Nursing note and vitals reviewed.   Results for orders placed or  performed in visit on 01/15/18 (from the past 72 hour(s))  CBC with Differential/Platelet     Status: Abnormal   Collection Time: 01/15/18  5:32 PM  Result Value Ref Range   WBC 4.5 3.4 - 10.8 x10E3/uL   RBC 4.49 4.14 - 5.80 x10E6/uL   Hemoglobin 12.8 (L) 13.0 - 17.7 g/dL   Hematocrit 37.7 37.5 - 51.0 %   MCV 84 79 - 97 fL   MCH 28.5 26.6 - 33.0 pg   MCHC 34.0 31.5 - 35.7 g/dL   RDW 13.5 12.3 - 15.4 %   Platelets 188 150 - 379 x10E3/uL   Neutrophils 49 Not Estab. %   Lymphs 38 Not Estab. %   Monocytes 8 Not Estab. %   Eos 4 Not Estab. %   Basos 1 Not Estab. %   Neutrophils Absolute 2.2 1.4 - 7.0 x10E3/uL   Lymphocytes Absolute 1.7 0.7 - 3.1 x10E3/uL   Monocytes Absolute 0.4 0.1 - 0.9 x10E3/uL   EOS (ABSOLUTE) 0.2 0.0 - 0.4 x10E3/uL   Basophils Absolute 0.0 0.0 - 0.2 x10E3/uL   Immature Granulocytes 0 Not Estab. %   Immature Grans (Abs) 0.0 0.0 - 0.1 x10E3/uL  TSH     Status: Abnormal   Collection Time: 01/15/18  5:32 PM  Result Value Ref Range   TSH 5.170 (H) 0.450 - 4.500 uIU/mL  T4, Free     Status: None   Collection Time: 01/15/18  5:32 PM  Result Value Ref Range   Free T4 1.11 0.82 - 1.77 ng/dL    No results found.  ASSESSMENT AND PLAN:  Ethan Lee was seen today for medication refill.  Diagnoses and all orders for this visit:  Hemoglobin decreased: Most recent colonoscopy completely normal in 2013. -     CBC with Differential/Platelet -     TSH -     T4, Free  Hypothyroidism due to Hashimoto's thyroiditis: Patient with a mild increase in his TSH however T4 remains stable.  This can be monitored at six-month intervals.  He has a physical in about that time we will recheck then. -     CBC with Differential/Platelet -     TSH -     T4, Free  Hashimoto's thyroiditis -     levothyroxine (SYNTHROID, LEVOTHROID) 25 MCG tablet; Take 1 tablet (25 mcg total) by mouth daily before breakfast.    The patient is advised to call or return to clinic if he does not see an  improvement in symptoms, or to seek the care of the closest emergency department if he worsens with the above plan.   Ethan Lee, MHS, PA-C [facility] 01/17/2018 11:15 AM

## 2018-01-15 NOTE — Patient Instructions (Signed)
     IF you received an x-ray today, you will receive an invoice from [facility]. Please contact [facility] at [phone] with questions or concerns regarding your invoice.   IF you received labwork today, you will receive an invoice from [facility]. Please contact [facility] at [phone] with questions or concerns regarding your invoice.   Our billing staff will not be able to assist you with questions regarding bills from these companies.  You will be contacted with the lab results as soon as they are available. The fastest way to get your results is to activate your My Chart account. Instructions are located on the last page of this paperwork. If you have not heard from us regarding the results in 2 weeks, please contact this office.     

## 2018-01-16 LAB — CBC WITH DIFFERENTIAL/PLATELET
BASOS ABS: 0 10*3/uL (ref 0.0–0.2)
Basos: 1 %
EOS (ABSOLUTE): 0.2 10*3/uL (ref 0.0–0.4)
EOS: 4 %
HEMATOCRIT: 37.7 % (ref 37.5–51.0)
HEMOGLOBIN: 12.8 g/dL — AB (ref 13.0–17.7)
IMMATURE GRANS (ABS): 0 10*3/uL (ref 0.0–0.1)
IMMATURE GRANULOCYTES: 0 %
LYMPHS: 38 %
Lymphocytes Absolute: 1.7 10*3/uL (ref 0.7–3.1)
MCH: 28.5 pg (ref 26.6–33.0)
MCHC: 34 g/dL (ref 31.5–35.7)
MCV: 84 fL (ref 79–97)
MONOCYTES: 8 %
Monocytes Absolute: 0.4 10*3/uL (ref 0.1–0.9)
NEUTROS PCT: 49 %
Neutrophils Absolute: 2.2 10*3/uL (ref 1.4–7.0)
Platelets: 188 10*3/uL (ref 150–379)
RBC: 4.49 x10E6/uL (ref 4.14–5.80)
RDW: 13.5 % (ref 12.3–15.4)
WBC: 4.5 10*3/uL (ref 3.4–10.8)

## 2018-01-16 LAB — T4, FREE: FREE T4: 1.11 ng/dL (ref 0.82–1.77)

## 2018-01-16 LAB — TSH: TSH: 5.17 u[IU]/mL — ABNORMAL HIGH (ref 0.450–4.500)

## 2018-02-25 ENCOUNTER — Encounter: Payer: Self-pay | Admitting: Family Medicine

## 2018-04-21 IMAGING — DX DG CHEST 2V
2 series · 2 of 2 positions shown · non-contrast
Comparison: 02/23/2010

CLINICAL DATA: 65-year-old male with right side pleuritic pain,
rhomboid muscle tenderness. Denies shortness of breath. Initial
encounter.

EXAM:
CHEST  2 VIEW

[chest pa]
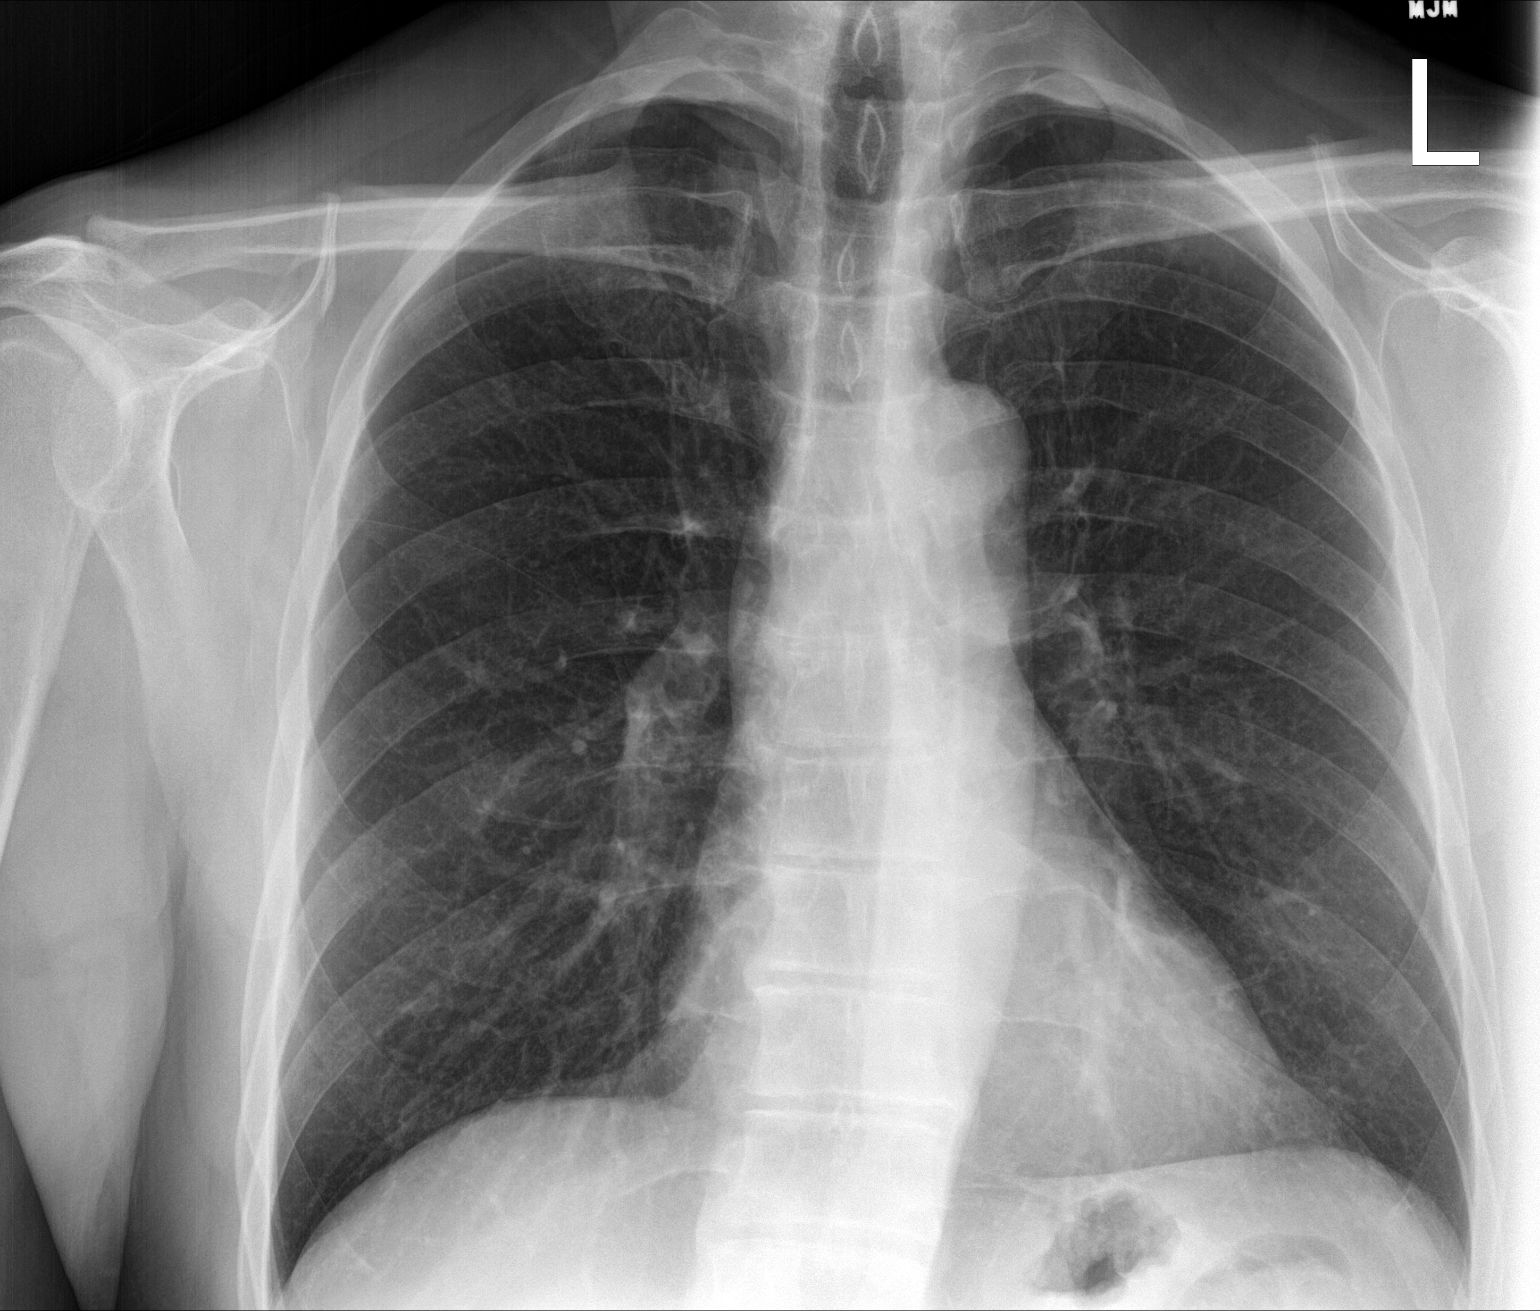

[chest lat]
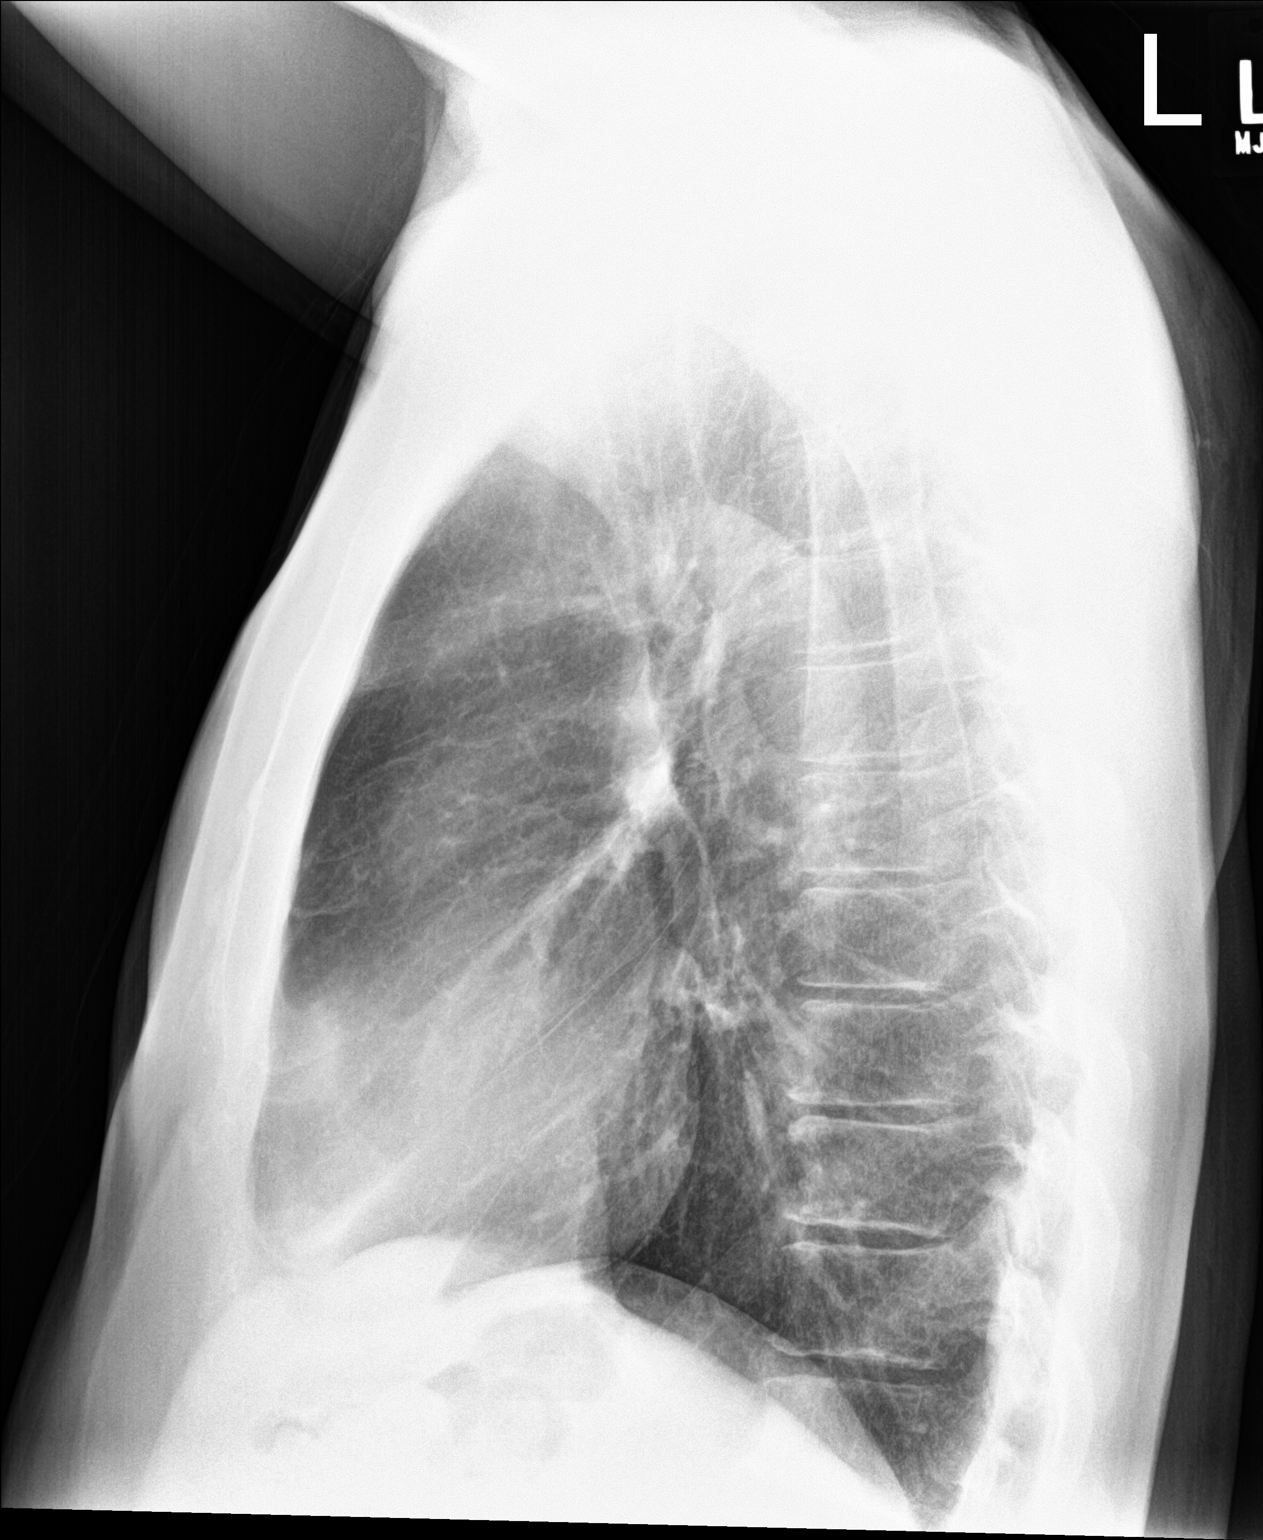

[2 of 2 positions shown; findings below may reference images not displayed]

FINDINGS: Lung volumes are stable and within normal limits. Normal cardiac
size and mediastinal contours. Visualized tracheal air column is
within normal limits. No pneumothorax, pulmonary edema, pleural
effusion or abnormal pulmonary opacity. No acute osseous abnormality
identified. Stable mild lower thoracic spine endplate spurring and
mild dextro convex curvature.
IMPRESSION: Stable and largely unremarkable radiographic appearance of the chest
since 02/23/2010.

## 2018-04-29 ENCOUNTER — Telehealth: Payer: Self-pay | Admitting: Physician Assistant

## 2018-05-06 ENCOUNTER — Telehealth: Payer: Self-pay | Admitting: Physician Assistant

## 2018-05-06 NOTE — Telephone Encounter (Signed)
Copied from [place] [id number]. Topic: Inquiry >> May 06, 2018  8:58 AM Mylinda Latina, NT wrote: Reason for CRM: patient called and states he would like to talk to Philis Fendt. He is wondering if he has any recommendation on a PCP at the office that the patient can transfer to . Please advise CB# [phone]

## 2018-05-09 NOTE — Telephone Encounter (Signed)
I would recommend Dr. Mitchel Honour for him. Philis Fendt, MS, PA-C 6:14 PM, 05/09/2018

## 2018-05-09 NOTE — Telephone Encounter (Signed)
Message sent to Legrand Como re: recommendations on PCP

## 2018-05-11 ENCOUNTER — Telehealth: Payer: Self-pay

## 2018-05-11 NOTE — Telephone Encounter (Signed)
Message via MyChart to pt with Marcell Anger suggestion for PCP

## 2018-05-12 NOTE — Telephone Encounter (Signed)
Ethan Lee recommended Dr. Mitchel Honour.  Pt notified.

## 2018-07-24 NOTE — Telephone Encounter (Signed)
done

## 2018-08-01 ENCOUNTER — Other Ambulatory Visit: Payer: Self-pay

## 2018-08-01 ENCOUNTER — Encounter: Payer: 59 | Admitting: Physician Assistant

## 2018-08-01 ENCOUNTER — Encounter: Payer: Self-pay | Admitting: Emergency Medicine

## 2018-08-01 ENCOUNTER — Ambulatory Visit (INDEPENDENT_AMBULATORY_CARE_PROVIDER_SITE_OTHER): Payer: 59 | Admitting: Emergency Medicine

## 2018-08-01 DIAGNOSIS — Z1329 Encounter for screening for other suspected endocrine disorder: Secondary | ICD-10-CM | POA: Diagnosis not present

## 2018-08-01 DIAGNOSIS — Z0000 Encounter for general adult medical examination without abnormal findings: Secondary | ICD-10-CM | POA: Diagnosis not present

## 2018-08-01 DIAGNOSIS — Z13220 Encounter for screening for lipoid disorders: Secondary | ICD-10-CM | POA: Diagnosis not present

## 2018-08-01 DIAGNOSIS — Z13228 Encounter for screening for other metabolic disorders: Secondary | ICD-10-CM

## 2018-08-01 DIAGNOSIS — Z130 Encounter for screening for diseases of the blood and blood-forming organs and certain disorders involving the immune mechanism: Secondary | ICD-10-CM
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
## 2018-08-01 NOTE — Patient Instructions (Addendum)

## 2018-08-02 LAB — COMPREHENSIVE METABOLIC PANEL
ALBUMIN: 4.6 g/dL (ref 3.6–4.8)
ALK PHOS: 56 IU/L (ref 39–117)
ALT: 9 IU/L (ref 0–44)
AST: 12 IU/L (ref 0–40)
Albumin/Globulin Ratio: 2 (ref 1.2–2.2)
BILIRUBIN TOTAL: 0.7 mg/dL (ref 0.0–1.2)
BUN/Creatinine Ratio: 14 (ref 10–24)
BUN: 15 mg/dL (ref 8–27)
CHLORIDE: 101 mmol/L (ref 96–106)
CO2: 27 mmol/L (ref 20–29)
Calcium: 9.6 mg/dL (ref 8.6–10.2)
Creatinine, Ser: 1.09 mg/dL (ref 0.76–1.27)
GFR calc non Af Amer: 70 mL/min/{1.73_m2} (ref >59)
GFR, EST AFRICAN AMERICAN: 81 mL/min/{1.73_m2} (ref >59)
GLOBULIN, TOTAL: 2.3 g/dL (ref 1.5–4.5)
GLUCOSE: 85 mg/dL (ref 65–99)
POTASSIUM: 4.2 mmol/L (ref 3.5–5.2)
Sodium: 142 mmol/L (ref 134–144)
Total Protein: 6.9 g/dL (ref 6.0–8.5)

## 2018-08-02 LAB — CBC WITH DIFFERENTIAL/PLATELET
Basophils Absolute: 0 10*3/uL (ref 0.0–0.2)
Basos: 1 %
EOS (ABSOLUTE): 0.2 10*3/uL (ref 0.0–0.4)
Eos: 4 %
HEMOGLOBIN: 13.9 g/dL (ref 13.0–17.7)
Hematocrit: 40.7 % (ref 37.5–51.0)
Immature Grans (Abs): 0 10*3/uL (ref 0.0–0.1)
Immature Granulocytes: 0 %
LYMPHS ABS: 1 10*3/uL (ref 0.7–3.1)
Lymphs: 23 %
MCH: 29.2 pg (ref 26.6–33.0)
MCHC: 34.2 g/dL (ref 31.5–35.7)
MCV: 86 fL (ref 79–97)
MONOCYTES: 9 %
MONOS ABS: 0.4 10*3/uL (ref 0.1–0.9)
NEUTROS ABS: 2.6 10*3/uL (ref 1.4–7.0)
Neutrophils: 63 %
Platelets: 187 10*3/uL (ref 150–450)
RBC: 4.76 x10E6/uL (ref 4.14–5.80)
RDW: 12.8 % (ref 12.3–15.4)
WBC: 4.1 10*3/uL (ref 3.4–10.8)

## 2018-08-02 LAB — PSA: PROSTATE SPECIFIC AG, SERUM: 1 ng/mL (ref 0.0–4.0)

## 2018-08-02 LAB — LIPID PANEL
CHOLESTEROL TOTAL: 175 mg/dL (ref 100–199)
Chol/HDL Ratio: 3.6 ratio (ref 0.0–5.0)
HDL: 48 mg/dL (ref >39)
LDL Calculated: 110 mg/dL — ABNORMAL HIGH (ref 0–99)
TRIGLYCERIDES: 84 mg/dL (ref 0–149)
VLDL CHOLESTEROL CAL: 17 mg/dL (ref 5–40)

## 2018-08-05 ENCOUNTER — Encounter: Payer: Self-pay | Admitting: *Deleted

## 2018-12-23 ENCOUNTER — Other Ambulatory Visit: Payer: Self-pay | Admitting: Physician Assistant

## 2018-12-23 DIAGNOSIS — E063 Autoimmune thyroiditis: Secondary | ICD-10-CM

## 2018-12-24 NOTE — Telephone Encounter (Signed)
Pt needs appt and blood work before additional refill

## 2019-03-19 ENCOUNTER — Other Ambulatory Visit: Payer: Self-pay | Admitting: Family Medicine

## 2019-03-19 DIAGNOSIS — E063 Autoimmune thyroiditis: Secondary | ICD-10-CM

## 2019-04-04 ENCOUNTER — Other Ambulatory Visit: Payer: Self-pay | Admitting: Family Medicine

## 2019-04-04 DIAGNOSIS — E063 Autoimmune thyroiditis: Secondary | ICD-10-CM

## 2019-05-31 ENCOUNTER — Other Ambulatory Visit: Payer: Self-pay | Admitting: Emergency Medicine

## 2019-05-31 DIAGNOSIS — E063 Autoimmune thyroiditis: Secondary | ICD-10-CM

## 2019-06-01 NOTE — Telephone Encounter (Signed)
Requested medication (s) are due for refill today: yes  Requested medication (s) are on the active medication list:yes  Last refill:  04/06/2019  Future visit scheduled: no  Notes to clinic:  Requesting 1 year supply   Requested Prescriptions  Pending Prescriptions Disp Refills   levothyroxine (SYNTHROID) 25 MCG tablet [Pharmacy Med Name: MYLAN-LEVOTHYROX 25MCG TABLET] 90 tablet 3    Sig: TAKE 1 TABLET BY MOUTH  DAILY BEFORE BREAKFAST     Endocrinology:  Hypothyroid Agents Failed - 05/31/2019  9:59 PM      Failed - TSH needs to be rechecked within 3 months after an abnormal result. Refill until TSH is due.      Failed - TSH in normal range and within 360 days    TSH  Date Value Ref Range Status  01/15/2018 5.170 (H) 0.450 - 4.500 uIU/mL Final         Passed - Valid encounter within last 12 months    Recent Outpatient Visits          10 months ago Routine general medical examination at a health care facility   [facility] [place], Ines Bloomer, MD   1 year ago Hemoglobin decreased   [facility] [place], Audrie Lia, PA-C   1 year ago Annual physical exam   [facility] [place], PA-C   2 years ago Irregular heart beat   [facility], Gelene Mink, PA-C   2 years ago Annual physical exam   [facility] at [place], [place]

## 2019-06-16 ENCOUNTER — Other Ambulatory Visit: Payer: Self-pay | Admitting: Emergency Medicine

## 2019-06-16 DIAGNOSIS — E063 Autoimmune thyroiditis: Secondary | ICD-10-CM

## 2019-06-17 NOTE — Telephone Encounter (Signed)
Requested medication (s) are due for refill today: yes  Requested medication (s) are on the active medication list: yes  Last refill:  04/06/2019  Future visit scheduled: no  Notes to clinic: requesting 1 year supply   Requested Prescriptions  Pending Prescriptions Disp Refills   levothyroxine (SYNTHROID) 25 MCG tablet [Pharmacy Med Name: MYLAN-LEVOTHYROX 25MCG TABLET] 90 tablet 3    Sig: TAKE 1 TABLET BY MOUTH  DAILY BEFORE BREAKFAST     Endocrinology:  Hypothyroid Agents Failed - 06/16/2019 10:00 PM      Failed - TSH needs to be rechecked within 3 months after an abnormal result. Refill until TSH is due.      Failed - TSH in normal range and within 360 days    TSH  Date Value Ref Range Status  01/15/2018 5.170 (H) 0.450 - 4.500 uIU/mL Final         Passed - Valid encounter within last 12 months    Recent Outpatient Visits          10 months ago Routine general medical examination at a health care facility   [facility] [place], Ines Bloomer, MD   1 year ago Hemoglobin decreased   [facility], Audrie Lia, PA-C   1 year ago Annual physical exam   [facility] [place], PA-C   2 years ago Irregular heart beat   [facility], Gelene Mink, PA-C   2 years ago Annual physical exam   [facility] at [place], [place]

## 2019-07-24 ENCOUNTER — Telehealth: Payer: Self-pay | Admitting: Emergency Medicine

## 2019-07-24 NOTE — Telephone Encounter (Signed)
Patient requesting a 90 day supply levothyroxine (SYNTHROID) 25 MCG tablet   informed please allow 48 to 72 hour turn around time.   [place], [place] [employer] [phone] (Phone) [phone] (Fax)

## 2019-08-03 NOTE — Telephone Encounter (Signed)
Patient called in checking on status of med refill. Please advise.

## 2019-08-05 NOTE — Telephone Encounter (Signed)
We can do it tomorrow.  Thanks.

## 2019-08-05 NOTE — Telephone Encounter (Signed)
I have called pt and he says that he has a CPE on tomorrow morning.   Please advise if you want to refill the meds after he is seen and after blood work.  Thanks, [employer]

## 2019-08-06 ENCOUNTER — Other Ambulatory Visit: Payer: Self-pay

## 2019-08-06 ENCOUNTER — Ambulatory Visit (INDEPENDENT_AMBULATORY_CARE_PROVIDER_SITE_OTHER): Payer: 59 | Admitting: Emergency Medicine

## 2019-08-06 ENCOUNTER — Encounter: Payer: Self-pay | Admitting: Emergency Medicine

## 2019-08-06 VITALS — BP 117/73 | HR 75 | Temp 98.2°F | Resp 16 | Ht 73.0 in | Wt 195.6 lb

## 2019-08-06 DIAGNOSIS — E063 Autoimmune thyroiditis: Secondary | ICD-10-CM

## 2019-08-06 DIAGNOSIS — Z0000 Encounter for general adult medical examination without abnormal findings: Secondary | ICD-10-CM

## 2019-08-06 DIAGNOSIS — Z1329 Encounter for screening for other suspected endocrine disorder: Secondary | ICD-10-CM

## 2019-08-06 DIAGNOSIS — Z125 Encounter for screening for malignant neoplasm of prostate: Secondary | ICD-10-CM

## 2019-08-06 DIAGNOSIS — Z0001 Encounter for general adult medical examination with abnormal findings: Secondary | ICD-10-CM

## 2019-08-06 DIAGNOSIS — Z13228 Encounter for screening for other metabolic disorders: Secondary | ICD-10-CM

## 2019-08-06 DIAGNOSIS — Z130 Encounter for screening for diseases of the blood and blood-forming organs and certain disorders involving the immune mechanism: Secondary | ICD-10-CM

## 2019-08-06 DIAGNOSIS — Z13220 Encounter for screening for lipoid disorders: Secondary | ICD-10-CM

## 2019-08-06 DIAGNOSIS — E038 Other specified hypothyroidism: Secondary | ICD-10-CM

## 2019-08-06 MED ORDER — LEVOTHYROXINE SODIUM 25 MCG PO TABS: 25.0000 ug | ORAL_TABLET | Freq: Every day | ORAL | 3 refills | Status: DC

## 2019-08-06 NOTE — Progress Notes (Signed)
Ethan Lee 68 y.o.   Chief Complaint  Patient presents with  . Annual Exam    HISTORY OF PRESENT ILLNESS: This is a 68 y.o. male here for his annual exam. Healthy male with a healthy lifestyle. Up-to-date with his health maintenance.  Reviewed. 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  Presently taking Synthroid 25 mcg daily. Very good nutrition.  Physically active. Has no complaints or medical concerns today.  HPI   Prior to Admission medications   Medication Sig Start Date End Date Taking? Authorizing Provider  cetirizine (ZYRTEC) 10 MG tablet Take 10 mg by mouth every evening.   Yes [provider]  cholecalciferol (VITAMIN D) 1000 UNITS tablet Take 1,000 Units by mouth daily.   Yes [provider]  loratadine (CLARITIN) 10 MG tablet Take 10 mg by mouth every morning.   Yes [provider]  vitamin B-12 (CYANOCOBALAMIN) 100 MCG tablet Take 100 mcg by mouth daily.   Yes [provider]  vitamin C (ASCORBIC ACID) 500 MG tablet Take 500 mg by mouth daily. Two tabs   Yes [provider]  vitamin E 400 UNIT capsule Take 400 Units by mouth daily.   Yes [provider]  aspirin 81 MG tablet Take 81 mg by mouth daily.    [provider]  levothyroxine (SYNTHROID) 25 MCG tablet Take 1 tablet (25 mcg total) by mouth daily before breakfast. 08/06/19   Horald Pollen, MD    Allergies  Allergen Reactions  . Peanuts [Peanut Oil]   . Shellfish Allergy     Patient Active Problem List   Diagnosis Date Noted  . Thyroid activity decreased 03/15/2016  . Abnormal heart sounds 07/15/2012  . Allergy history, peanuts 06/30/2012  . Allergy history unknown 06/30/2012    Past Medical History:  Diagnosis Date  . Allergy   . Hypothyroid     Past Surgical History:  Procedure Laterality Date  . KNEE SURGERY     x 4  . TONSILLECTOMY AND ADENOIDECTOMY    . Vocal cord polyps       Social History   Socioeconomic History  . Marital status: Married    Spouse name: Not on file  . Number of children: 2  . Years of education: Not on file  . Highest education level: Not on file  Occupational History    Employer: [employer]  . Financial resource strain: Not on file  . Food insecurity    Worry: Not on file    Inability: Not on file  . Transportation needs    Medical: Not on file    Non-medical: Not on file  Tobacco Use  . Smoking status: Never Smoker  . Smokeless tobacco: Never Used  Substance and Sexual Activity  . Alcohol use: Yes    Alcohol/week: 3.0 standard drinks    Types: 3 Glasses of wine per week  . Drug use: No  . Sexual activity: Yes  Lifestyle  . Physical activity    Days per week: Not on file    Minutes per session: Not on file  . Stress: Not on file  Relationships  . Social Herbalist on phone: Not on file    Gets together: Not on file    Attends religious service: Not on file    Active member of club or organization: Not on file    Attends meetings of clubs or organizations: Not on file    Relationship status: Not  on file  . Intimate partner violence    Fear of current or ex partner: Not on file    Emotionally abused: Not on file    Physically abused: Not on file    Forced sexual activity: Not on file  Other Topics Concern  . Not on file  Social History Narrative   Lives with wife and works at [employer].  One granddaughter.        Family History  Problem Relation Age of Onset  . Cancer Mother        COLON CANCER  . Leukemia Father   . Other Father        blood disorder  . Cancer Brother        testicular cancer  . Cancer Brother        liver/kidney cancer     Review of Systems  Constitutional: Negative.  Negative for chills, fever and weight loss.  HENT: Negative.  Negative for congestion, hearing loss and sore throat.   Eyes: Negative.  Negative for blurred vision and double vision.  Respiratory:  Negative.  Negative for cough and shortness of breath.   Cardiovascular: Negative.  Negative for chest pain and palpitations.  Gastrointestinal: Negative.  Negative for abdominal pain, blood in stool, melena, nausea and vomiting.  Genitourinary: Negative.  Negative for dysuria and hematuria.  Musculoskeletal: Negative.  Negative for back pain, myalgias and neck pain.  Skin: Negative.  Negative for rash.  Neurological: Negative.  Negative for dizziness and headaches.  Endo/Heme/Allergies: Negative.   All other systems reviewed and are negative.   Vitals:   08/06/19 0806  BP: 117/73  Pulse: 75  Resp: 16  Temp: 98.2 F (36.8 C)  SpO2: 96%    Physical Exam Vitals signs reviewed.  Constitutional:      Appearance: Normal appearance.  HENT:     Head: Normocephalic and atraumatic.  Eyes:     Extraocular Movements: Extraocular movements intact.     Conjunctiva/sclera: Conjunctivae normal.     Pupils: Pupils are equal, round, and reactive to light.  Neck:     Musculoskeletal: Normal range of motion and neck supple.  Cardiovascular:     Rate and Rhythm: Normal rate and regular rhythm.     Pulses: Normal pulses.     Heart sounds: Normal heart sounds.  Pulmonary:     Effort: Pulmonary effort is normal.     Breath sounds: Normal breath sounds.  Abdominal:     General: Bowel sounds are normal. There is no distension.     Palpations: Abdomen is soft. There is no mass.     Tenderness: There is no abdominal tenderness. There is no guarding.  Musculoskeletal: Normal range of motion.  Skin:    General: Skin is warm and dry.     Capillary Refill: Capillary refill takes less than 2 seconds.  Neurological:     General: No focal deficit present.     Mental Status: He is alert and oriented to person, place, and time.  Psychiatric:        Mood and Affect: Mood normal.        Behavior: Behavior normal.      ASSESSMENT & PLAN: [place] was seen today for annual exam.  Diagnoses and  all orders for this visit:  Routine general medical examination at a health care facility  Hypothyroidism due to Hashimoto's thyroiditis -     levothyroxine (SYNTHROID) 25 MCG tablet; Take 1 tablet (25 mcg total) by mouth daily  before breakfast. -     TSH  Prostate cancer screening -     PSA(Must document that pt has been informed of limitations of PSA testing.)  Screening for deficiency anemia -     CBC with Differential  Screening for lipoid disorders -     Lipid panel  Screening for endocrine, metabolic and immunity disorder -     Comprehensive metabolic panel -     Hemoglobin A1c    Patient Instructions    Health Maintenance, Male Adopting a healthy lifestyle and getting preventive care are important in promoting health and wellness. Ask your health care provider about:  The right schedule for you to have regular tests and exams.  Things you can do on your own to prevent diseases and keep yourself healthy. What should I know about diet, weight, and exercise? Eat a healthy diet   Eat a diet that includes plenty of vegetables, fruits, low-fat dairy products, and lean protein.  Do not eat a lot of foods that are high in solid fats, added sugars, or sodium. Maintain a healthy weight Body mass index (BMI) is a measurement that can be used to identify possible weight problems. It estimates body fat based on height and weight. Your health care provider can help determine your BMI and help you achieve or maintain a healthy weight. Get regular exercise Get regular exercise. This is one of the most important things you can do for your health. Most adults should:  Exercise for at least 150 minutes each week. The exercise should increase your heart rate and make you sweat (moderate-intensity exercise).  Do strengthening exercises at least twice a week. This is in addition to the moderate-intensity exercise.  Spend less time sitting. Even light physical activity can be  beneficial. Watch cholesterol and blood lipids Have your blood tested for lipids and cholesterol at 68 years of age, then have this test every 5 years. You may need to have your cholesterol levels checked more often if:  Your lipid or cholesterol levels are high.  You are older than 68 years of age.  You are at high risk for heart disease. What should I know about cancer screening? Many types of cancers can be detected early and may often be prevented. Depending on your health history and family history, you may need to have cancer screening at various ages. This may include screening for:  Colorectal cancer.  Prostate cancer.  Skin cancer.  Lung cancer. What should I know about heart disease, diabetes, and high blood pressure? Blood pressure and heart disease  High blood pressure causes heart disease and increases the risk of stroke. This is more likely to develop in people who have high blood pressure readings, are of African descent, or are overweight.  Talk with your health care provider about your target blood pressure readings.  Have your blood pressure checked: ? Every 3-5 years if you are 39-55 years of age. ? Every year if you are 12 years old or older.  If you are between the ages of 66 and 27 and are a current or former smoker, ask your health care provider if you should have a one-time screening for abdominal aortic aneurysm (AAA). Diabetes Have regular diabetes screenings. This checks your fasting blood sugar level. Have the screening done:  Once every three years after age 68 if you are at a normal weight and have a low risk for diabetes.  More often and at a younger age if you are  overweight or have a high risk for diabetes. What should I know about preventing infection? Hepatitis B If you have a higher risk for hepatitis B, you should be screened for this virus. Talk with your health care provider to find out if you are at risk for hepatitis B infection.  Hepatitis C Blood testing is recommended for:  Everyone born from [birth year] through 1965.  Anyone with known risk factors for hepatitis C. Sexually transmitted infections (STIs)  You should be screened each year for STIs, including gonorrhea and chlamydia, if: ? You are sexually active and are younger than 68 years of age. ? You are older than 68 years of age and your health care provider tells you that you are at risk for this type of infection. ? Your sexual activity has changed since you were last screened, and you are at increased risk for chlamydia or gonorrhea. Ask your health care provider if you are at risk.  Ask your health care provider about whether you are at high risk for HIV. Your health care provider may recommend a prescription medicine to help prevent HIV infection. If you choose to take medicine to prevent HIV, you should first get tested for HIV. You should then be tested every 3 months for as long as you are taking the medicine. Follow these instructions at home: Lifestyle  Do not use any products that contain nicotine or tobacco, such as cigarettes, e-cigarettes, and chewing tobacco. If you need help quitting, ask your health care provider.  Do not use street drugs.  Do not share needles.  Ask your health care provider for help if you need support or information about quitting drugs. Alcohol use  Do not drink alcohol if your health care provider tells you not to drink.  If you drink alcohol: ? Limit how much you have to 0-2 drinks a day. ? Be aware of how much alcohol is in your drink. In the [place] one drink equals one 12 oz bottle of beer (355 mL), one 5 oz glass of wine (148 mL), or one 1 oz glass of hard liquor (44 mL). General instructions  Schedule regular health, dental, and eye exams.  Stay current with your vaccines.  Tell your health care provider if: ? You often feel depressed. ? You have ever been abused or do not feel safe at home. Summary   Adopting a healthy lifestyle and getting preventive care are important in promoting health and wellness.  Follow your health care provider's instructions about healthy diet, exercising, and getting tested or screened for diseases.  Follow your health care provider's instructions on monitoring your cholesterol and blood pressure. This information is not intended to replace advice given to you by your health care provider. Make sure you discuss any questions you have with your health care provider. Document Released: 03/15/2008 Document Revised: 09/10/2018 Document Reviewed: 09/10/2018 Elsevier Patient Education  2020 [employer]      Agustina Caroli, MD [facility]

## 2019-08-06 NOTE — Patient Instructions (Signed)

## 2019-08-07 ENCOUNTER — Encounter: Payer: Self-pay | Admitting: Emergency Medicine

## 2019-08-07 LAB — CBC WITH DIFFERENTIAL/PLATELET
Basophils Absolute: 0 10*3/uL (ref 0.0–0.2)
Basos: 1 %
EOS (ABSOLUTE): 0.2 10*3/uL (ref 0.0–0.4)
Eos: 3 %
Hematocrit: 44.5 % (ref 37.5–51.0)
Hemoglobin: 15.1 g/dL (ref 13.0–17.7)
Immature Grans (Abs): 0 10*3/uL (ref 0.0–0.1)
Immature Granulocytes: 0 %
Lymphocytes Absolute: 1 10*3/uL (ref 0.7–3.1)
Lymphs: 19 %
MCH: 28.3 pg (ref 26.6–33.0)
MCHC: 33.9 g/dL (ref 31.5–35.7)
MCV: 83 fL (ref 79–97)
Monocytes Absolute: 0.5 10*3/uL (ref 0.1–0.9)
Monocytes: 8 %
Neutrophils Absolute: 3.9 10*3/uL (ref 1.4–7.0)
Neutrophils: 69 %
Platelets: 170 10*3/uL (ref 150–450)
RBC: 5.34 x10E6/uL (ref 4.14–5.80)
RDW: 12.9 % (ref 11.6–15.4)
WBC: 5.6 10*3/uL (ref 3.4–10.8)

## 2019-08-07 LAB — LIPID PANEL
Chol/HDL Ratio: 3.7 ratio (ref 0.0–5.0)
Cholesterol, Total: 169 mg/dL (ref 100–199)
HDL: 46 mg/dL (ref >39)
LDL Chol Calc (NIH): 110 mg/dL — ABNORMAL HIGH (ref 0–99)
Triglycerides: 68 mg/dL (ref 0–149)
VLDL Cholesterol Cal: 13 mg/dL (ref 5–40)

## 2019-08-07 LAB — COMPREHENSIVE METABOLIC PANEL
ALT: 13 IU/L (ref 0–44)
AST: 17 IU/L (ref 0–40)
Albumin/Globulin Ratio: 1.9 (ref 1.2–2.2)
Albumin: 4.7 g/dL (ref 3.8–4.8)
Alkaline Phosphatase: 70 IU/L (ref 39–117)
BUN/Creatinine Ratio: 22 (ref 10–24)
BUN: 23 mg/dL (ref 8–27)
Bilirubin Total: 0.4 mg/dL (ref 0.0–1.2)
CO2: 21 mmol/L (ref 20–29)
Calcium: 9.7 mg/dL (ref 8.6–10.2)
Chloride: 102 mmol/L (ref 96–106)
Creatinine, Ser: 1.03 mg/dL (ref 0.76–1.27)
GFR calc Af Amer: 86 mL/min/{1.73_m2} (ref >59)
GFR calc non Af Amer: 74 mL/min/{1.73_m2} (ref >59)
Globulin, Total: 2.5 g/dL (ref 1.5–4.5)
Glucose: 94 mg/dL (ref 65–99)
Potassium: 4.4 mmol/L (ref 3.5–5.2)
Sodium: 139 mmol/L (ref 134–144)
Total Protein: 7.2 g/dL (ref 6.0–8.5)

## 2019-08-07 LAB — PSA: Prostate Specific Ag, Serum: 2 ng/mL (ref 0.0–4.0)

## 2019-08-07 LAB — HEMOGLOBIN A1C
Est. average glucose Bld gHb Est-mCnc: 111 mg/dL
Hgb A1c MFr Bld: 5.5 % (ref 4.8–5.6)

## 2019-08-07 LAB — TSH: TSH: 4.1 u[IU]/mL (ref 0.450–4.500)

## 2019-11-12 ENCOUNTER — Encounter: Payer: Self-pay | Admitting: Emergency Medicine

## 2019-12-24 ENCOUNTER — Telehealth: Payer: Self-pay

## 2019-12-24 NOTE — Telephone Encounter (Signed)
Pt called to make aware of covid vaccines received

## 2020-01-15 ENCOUNTER — Encounter: Payer: Self-pay | Admitting: Emergency Medicine

## 2020-01-26 ENCOUNTER — Other Ambulatory Visit: Payer: Self-pay

## 2020-01-26 ENCOUNTER — Ambulatory Visit: Payer: 59 | Admitting: Emergency Medicine

## 2020-01-26 ENCOUNTER — Encounter: Payer: Self-pay | Admitting: Emergency Medicine

## 2020-01-26 VITALS — BP 117/71 | HR 63 | Temp 97.7°F | Resp 16 | Ht 73.0 in | Wt 197.0 lb

## 2020-01-26 DIAGNOSIS — R21 Rash and other nonspecific skin eruption: Secondary | ICD-10-CM

## 2020-01-26 DIAGNOSIS — R5383 Other fatigue: Secondary | ICD-10-CM

## 2020-01-26 DIAGNOSIS — E038 Other specified hypothyroidism: Secondary | ICD-10-CM

## 2020-01-26 DIAGNOSIS — E063 Autoimmune thyroiditis: Secondary | ICD-10-CM | POA: Diagnosis not present

## 2020-01-26 LAB — POCT CBC
Granulocyte percent: 59 %G (ref 37–80)
HCT, POC: 39.6 % (ref 29–41)
Hemoglobin: 12.8 g/dL (ref 11–14.6)
Lymph, poc: 1.1 (ref 0.6–3.4)
MCH, POC: 28.7 pg (ref 27–31.2)
MCHC: 32.4 g/dL (ref 31.8–35.4)
MCV: 88.5 fL (ref 76–111)
MID (cbc): 0.3 (ref 0–0.9)
MPV: 7 fL (ref 0–99.8)
POC Granulocyte: 2 (ref 2–6.9)
POC LYMPH PERCENT: 32.4 %L (ref 10–50)
POC MID %: 8.6 %M (ref 0–12)
Platelet Count, POC: 151 10*3/uL (ref 142–424)
RBC: 4.48 M/uL — AB (ref 4.69–6.13)
RDW, POC: 12.5 %
WBC: 3.4 10*3/uL — AB (ref 4.6–10.2)

## 2020-01-26 LAB — POCT URINALYSIS DIP (MANUAL ENTRY)
Bilirubin, UA: NEGATIVE
Blood, UA: NEGATIVE
Glucose, UA: NEGATIVE mg/dL
Ketones, POC UA: NEGATIVE mg/dL
Leukocytes, UA: NEGATIVE
Nitrite, UA: NEGATIVE
Protein Ur, POC: NEGATIVE mg/dL
Spec Grav, UA: 1.025 (ref 1.010–1.025)
Urobilinogen, UA: 0.2 E.U./dL
pH, UA: 5.5 (ref 5.0–8.0)

## 2020-01-26 NOTE — Progress Notes (Signed)
Mister Krahenbuhl 69 y.o.   Chief Complaint  Patient presents with  . Fatigue    off and on for 1-2 months  . Rash    both arms per pt maybe dry skin    HISTORY OF PRESENT ILLNESS: This is a 69 y.o. male with history of Hashimoto's thyroiditis and resultant hypothyroidism complaining of feeling fatigued and tired on and off for the past 2 months.  Also noted a dry known itchy rash mostly arms.  No changes in his daily routines.  No changes in eating habits.  No history of sleep apnea and no sleep apnea daily symptoms.  No new medications.  Only taking Synthroid 25 mcg daily.  Denies flulike symptoms.  Fully vaccinated against Covid.  Denies melena or rectal bleeding.  No fever or chills.  No history to suggest vasculitis syndrome.  Denies weight loss, fever or chills.  No history of kidney disease.  Non-smoker.  No history of EtOH abuse.  Occasional wine drinker during the weekend.  Walks regularly.  Thinking about retirement may be creating some stress.  No other complaints or medical concerns today.  HPI   Prior to Admission medications   Medication Sig Start Date End Date Taking? Authorizing Provider  cetirizine (ZYRTEC) 10 MG tablet Take 10 mg by mouth every evening.   Yes [provider]  cholecalciferol (VITAMIN D) 1000 UNITS tablet Take 1,000 Units by mouth daily.   Yes [provider]  levothyroxine (SYNTHROID) 25 MCG tablet Take 1 tablet (25 mcg total) by mouth daily before breakfast. 08/06/19  Yes Taijuan Serviss, Ines Bloomer, MD  loratadine (CLARITIN) 10 MG tablet Take 10 mg by mouth every morning.   Yes [provider]  vitamin B-12 (CYANOCOBALAMIN) 100 MCG tablet Take 100 mcg by mouth daily.   Yes [provider]  vitamin C (ASCORBIC ACID) 500 MG tablet Take 500 mg by mouth daily. Two tabs   Yes [provider]  vitamin E 400 UNIT capsule Take 400 Units by mouth daily.   Yes [provider]  aspirin 81 MG tablet Take 81 mg by mouth  daily.    [provider]    Allergies  Allergen Reactions  . Peanuts [Peanut Oil]   . Shellfish Allergy     Patient Active Problem List   Diagnosis Date Noted  . Thyroid activity decreased 03/15/2016  . Abnormal heart sounds 07/15/2012  . Allergy history, peanuts 06/30/2012    Past Medical History:  Diagnosis Date  . Allergy   . Hypothyroid     Past Surgical History:  Procedure Laterality Date  . KNEE SURGERY     x 4  . TONSILLECTOMY AND ADENOIDECTOMY    . Vocal cord polyps      Social History   Socioeconomic History  . Marital status: Married    Spouse name: Not on file  . Number of children: 2  . Years of education: Not on file  . Highest education level: Not on file  Occupational History    Employer: [employer]  Tobacco Use  . Smoking status: Never Smoker  . Smokeless tobacco: Never Used  Substance and Sexual Activity  . Alcohol use: Yes    Alcohol/week: 3.0 standard drinks    Types: 3 Glasses of wine per week  . Drug use: No  . Sexual activity: Yes  Other Topics Concern  . Not on file  Social History Narrative   Lives with wife and works at [employer].  One granddaughter.  Social Determinants of Health   Financial Resource Strain:   . Difficulty of Paying Living Expenses:   Food Insecurity:   . Worried About Charity fundraiser in the Last Year:   . Arboriculturist in the Last Year:   Transportation Needs:   . Film/video editor (Medical):   Marland Kitchen Lack of Transportation (Non-Medical):   Physical Activity:   . Days of Exercise per Week:   . Minutes of Exercise per Session:   Stress:   . Feeling of Stress :   Social Connections:   . Frequency of Communication with Friends and Family:   . Frequency of Social Gatherings with Friends and Family:   . Attends Religious Services:   . Active Member of Clubs or Organizations:   . Attends Archivist Meetings:   Marland Kitchen Marital Status:   Intimate Partner Violence:   . Fear of Current  or Ex-Partner:   . Emotionally Abused:   Marland Kitchen Physically Abused:   . Sexually Abused:     Family History  Problem Relation Age of Onset  . Cancer Mother        COLON CANCER  . Leukemia Father   . Other Father        blood disorder  . Cancer Brother        testicular cancer  . Cancer Brother        liver/kidney cancer     Review of Systems  Constitutional: Positive for malaise/fatigue. Negative for chills, fever and weight loss.  HENT: Negative.  Negative for congestion and sore throat.   Eyes: Negative.  Negative for blurred vision.  Respiratory: Negative.  Negative for cough and shortness of breath.   Cardiovascular: Negative.  Negative for chest pain and palpitations.  Gastrointestinal: Negative.  Negative for abdominal pain, blood in stool, diarrhea, melena, nausea and vomiting.  Genitourinary: Negative.  Negative for dysuria, frequency and hematuria.  Musculoskeletal: Negative.  Negative for back pain, myalgias and neck pain.  Skin: Positive for rash. Negative for itching.  Neurological: Negative.  Negative for dizziness and headaches.  Endo/Heme/Allergies: Negative.   All other systems reviewed and are negative.  Today's Vitals   01/26/20 0805  BP: 117/71  Pulse: 63  Resp: 16  Temp: 97.7 F (36.5 C)  TempSrc: Temporal  SpO2: 97%  Weight: 197 lb (89.4 kg)  Height: 6' 1" (1.854 m)   Body mass index is 25.99 kg/m. Wt Readings from Last 3 Encounters:  01/26/20 197 lb (89.4 kg)  08/06/19 195 lb 9.6 oz (88.7 kg)  08/01/18 191 lb 6.4 oz (86.8 kg)     Physical Exam Vitals reviewed.  Constitutional:      Appearance: Normal appearance.  HENT:     Head: Normocephalic.     Mouth/Throat:     Mouth: Mucous membranes are moist.     Pharynx: Oropharynx is clear.  Eyes:     Extraocular Movements: Extraocular movements intact.     Conjunctiva/sclera: Conjunctivae normal.     Pupils: Pupils are equal, round, and reactive to light.  Cardiovascular:     Rate and  Rhythm: Normal rate and regular rhythm.     Pulses: Normal pulses.     Heart sounds: Normal heart sounds.  Pulmonary:     Effort: Pulmonary effort is normal.     Breath sounds: Normal breath sounds.  Abdominal:     General: Bowel sounds are normal. There is no distension.     Palpations: Abdomen is soft.  Tenderness: There is no abdominal tenderness.  Musculoskeletal:        General: Normal range of motion.     Cervical back: Normal range of motion and neck supple. No tenderness.  Lymphadenopathy:     Cervical: No cervical adenopathy.  Skin:    General: Skin is warm and dry.     Capillary Refill: Capillary refill takes less than 2 seconds.  Neurological:     General: No focal deficit present.     Mental Status: He is alert and oriented to person, place, and time.  Psychiatric:        Mood and Affect: Mood normal.        Behavior: Behavior normal.    Results for orders placed or performed in visit on 01/26/20 (from the past 24 hour(s))  POCT urinalysis dipstick     Status: None   Collection Time: 01/26/20  9:01 AM  Result Value Ref Range   Color, UA yellow yellow   Clarity, UA clear clear   Glucose, UA negative negative mg/dL   Bilirubin, UA negative negative   Ketones, POC UA negative negative mg/dL   Spec Grav, UA 1.025 1.010 - 1.025   Blood, UA negative negative   pH, UA 5.5 5.0 - 8.0   Protein Ur, POC negative negative mg/dL   Urobilinogen, UA 0.2 0.2 or 1.0 E.U./dL   Nitrite, UA Negative Negative   Leukocytes, UA Negative Negative  POCT CBC     Status: Abnormal   Collection Time: 01/26/20  9:02 AM  Result Value Ref Range   WBC 3.4 (A) 4.6 - 10.2 K/uL   Lymph, poc 1.1 0.6 - 3.4   POC LYMPH PERCENT 32.4 10 - 50 %L   MID (cbc) 0.3 0 - 0.9   POC MID % 8.6 0 - 12 %M   POC Granulocyte 2.0 2 - 6.9   Granulocyte percent 59.0 37 - 80 %G   RBC 4.48 (A) 4.69 - 6.13 M/uL   Hemoglobin 12.8 11 - 14.6 g/dL   HCT, POC 39.6 29 - 41 %   MCV 88.5 76 - 111 fL   MCH, POC  28.7 27 - 31.2 pg   MCHC 32.4 31.8 - 35.4 g/dL   RDW, POC 12.5 %   Platelet Count, POC 151 142 - 424 K/uL   MPV 7.0 0 - 99.8 fL     ASSESSMENT & PLAN: Ethan Lee was seen today for fatigue and rash.  Diagnoses and all orders for this visit:  Hypothyroidism due to Hashimoto's thyroiditis -     Cancel: TSH -     TSH  Tiredness -     POCT urinalysis dipstick -     POCT CBC -     Comprehensive metabolic panel -     TSH -     TestT+TestF+SHBG -     Vitamin B12 -     VITAMIN D 25 Hydroxy (Vit-D Deficiency, Fractures)  Fatigue, unspecified type -     POCT urinalysis dipstick -     POCT CBC -     Comprehensive metabolic panel -     TSH -     TestT+TestF+SHBG -     Vitamin B12 -     VITAMIN D 25 Hydroxy (Vit-D Deficiency, Fractures)  Rash and nonspecific skin eruption    Patient Instructions       If you have lab work done today you will be contacted with your lab results within the next 2 weeks.  If you have not heard from [place] then please contact us. The fastest way to get your results is to register for My Chart.   IF you received an x-ray today, you will receive an invoice from [facility]. Please contact [facility] at [phone] with questions or concerns regarding your invoice.   IF you received labwork today, you will receive an invoice from [facility]. Please contact [facility] at [phone] with questions or concerns regarding your invoice.   Our billing staff will not be able to assist you with questions regarding bills from these companies.  You will be contacted with the lab results as soon as they are available. The fastest way to get your results is to activate your My Chart account. Instructions are located on the last page of this paperwork. If you have not heard from [place] regarding the results in 2 weeks, please contact this office.     Fatigue If you have fatigue, you feel tired all the time and have a lack of energy or a lack of  motivation. Fatigue may make it difficult to start or complete tasks because of exhaustion. In general, occasional or mild fatigue is often a normal response to activity or life. However, long-lasting (chronic) or extreme fatigue may be a symptom of a medical condition. Follow these instructions at home: General instructions  Watch your fatigue for any changes.  Go to bed and get up at the same time every day.  Avoid fatigue by pacing yourself during the day and getting enough sleep at night.  Maintain a healthy weight. Medicines  Take over-the-counter and prescription medicines only as told by your health care provider.  Take a multivitamin, if told by your health care provider.  Do not use herbal or dietary supplements unless they are approved by your health care provider. Activity   Exercise regularly, as told by your health care provider.  Use or practice techniques to help you relax, such as yoga, tai chi, meditation, or massage therapy. Eating and drinking   Avoid heavy meals in the evening.  Eat a well-balanced diet, which includes lean proteins, whole grains, plenty of fruits and vegetables, and low-fat dairy products.  Avoid consuming too much caffeine.  Avoid the use of alcohol.  Drink enough fluid to keep your urine pale yellow. Lifestyle  Change situations that cause you stress. Try to keep your work and personal schedule in balance.  Do not use any products that contain nicotine or tobacco, such as cigarettes and e-cigarettes. If you need help quitting, ask your health care provider.  Do not use drugs. Contact a health care provider if:  Your fatigue does not get better.  You have a fever.  You suddenly lose or gain weight.  You have headaches.  You have trouble falling asleep or sleeping through the night.  You feel angry, guilty, anxious, or sad.  You are unable to have a bowel movement (constipation).  Your skin is dry.  You have swelling  in your legs or another part of your body. Get help right away if:  You feel confused.  Your vision is blurry.  You feel faint or you pass out.  You have a severe headache.  You have severe pain in your abdomen, your back, or the area between your waist and hips (pelvis).  You have chest pain, shortness of breath, or an irregular or fast heartbeat.  You are unable to urinate, or you urinate less than normal.  You have abnormal bleeding,  such as bleeding from the rectum, vagina, nose, lungs, or nipples.  You vomit blood.  You have thoughts about hurting yourself or others. If you ever feel like you may hurt yourself or others, or have thoughts about taking your own life, get help right away. You can go to your nearest emergency department or call:  Your local emergency services (911 in the [place]  A suicide crisis helpline, such as the [facility] at 682 577 1688. This is open 24 hours a day. Summary  If you have fatigue, you feel tired all the time and have a lack of energy or a lack of motivation.  Fatigue may make it difficult to start or complete tasks because of exhaustion.  Long-lasting (chronic) or extreme fatigue may be a symptom of a medical condition.  Exercise regularly, as told by your health care provider.  Change situations that cause you stress. Try to keep your work and personal schedule in balance. This information is not intended to replace advice given to you by your health care provider. Make sure you discuss any questions you have with your health care provider. Document Revised: 04/08/2019 Document Reviewed: 06/12/2017 Elsevier Patient Education  2020 Elsevier Inc.      Agustina Caroli, MD [facility]

## 2020-01-26 NOTE — Patient Instructions (Addendum)
   If you have lab work done today you will be contacted with your lab results within the next 2 weeks.  If you have not heard from us then please contact us. The fastest way to get your results is to register for My Chart.   IF you received an x-ray today, you will receive an invoice from [facility]. Please contact  Radiology at [phone] with questions or concerns regarding your invoice.   IF you received labwork today, you will receive an invoice from [facility]. Please contact [facility] at [phone] with questions or concerns regarding your invoice.   Our billing staff will not be able to assist you with questions regarding bills from these companies.  You will be contacted with the lab results as soon as they are available. The fastest way to get your results is to activate your My Chart account. Instructions are located on the last page of this paperwork. If you have not heard from us regarding the results in 2 weeks, please contact this office.     Fatigue If you have fatigue, you feel tired all the time and have a lack of energy or a lack of motivation. Fatigue may make it difficult to start or complete tasks because of exhaustion. In general, occasional or mild fatigue is often a normal response to activity or life. However, long-lasting (chronic) or extreme fatigue may be a symptom of a medical condition. Follow these instructions at home: General instructions  Watch your fatigue for any changes.  Go to bed and get up at the same time every day.  Avoid fatigue by pacing yourself during the day and getting enough sleep at night.  Maintain a healthy weight. Medicines  Take over-the-counter and prescription medicines only as told by your health care provider.  Take a multivitamin, if told by your health care provider.  Do not use herbal or dietary supplements unless they are approved by your health care provider. Activity   Exercise regularly, as  told by your health care provider.  Use or practice techniques to help you relax, such as yoga, tai chi, meditation, or massage therapy. Eating and drinking   Avoid heavy meals in the evening.  Eat a well-balanced diet, which includes lean proteins, whole grains, plenty of fruits and vegetables, and low-fat dairy products.  Avoid consuming too much caffeine.  Avoid the use of alcohol.  Drink enough fluid to keep your urine pale yellow. Lifestyle  Change situations that cause you stress. Try to keep your work and personal schedule in balance.  Do not use any products that contain nicotine or tobacco, such as cigarettes and e-cigarettes. If you need help quitting, ask your health care provider.  Do not use drugs. Contact a health care provider if:  Your fatigue does not get better.  You have a fever.  You suddenly lose or gain weight.  You have headaches.  You have trouble falling asleep or sleeping through the night.  You feel angry, guilty, anxious, or sad.  You are unable to have a bowel movement (constipation).  Your skin is dry.  You have swelling in your legs or another part of your body. Get help right away if:  You feel confused.  Your vision is blurry.  You feel faint or you pass out.  You have a severe headache.  You have severe pain in your abdomen, your back, or the area between your waist and hips (pelvis).  You have chest pain, shortness of breath,   or an irregular or fast heartbeat.  You are unable to urinate, or you urinate less than normal.  You have abnormal bleeding, such as bleeding from the rectum, vagina, nose, lungs, or nipples.  You vomit blood.  You have thoughts about hurting yourself or others. If you ever feel like you may hurt yourself or others, or have thoughts about taking your own life, get help right away. You can go to your nearest emergency department or call:  Your local emergency services (911 in the [place]  A  suicide crisis helpline, such as the National Suicide Prevention Lifeline at 1-800-273-8255. This is open 24 hours a day. Summary  If you have fatigue, you feel tired all the time and have a lack of energy or a lack of motivation.  Fatigue may make it difficult to start or complete tasks because of exhaustion.  Long-lasting (chronic) or extreme fatigue may be a symptom of a medical condition.  Exercise regularly, as told by your health care provider.  Change situations that cause you stress. Try to keep your work and personal schedule in balance. This information is not intended to replace advice given to you by your health care provider. Make sure you discuss any questions you have with your health care provider. Document Revised: 04/08/2019 Document Reviewed: 06/12/2017 Elsevier Patient Education  2020 Elsevier Inc.  

## 2020-01-29 LAB — TSH: TSH: 2.93 u[IU]/mL (ref 0.450–4.500)

## 2020-01-29 LAB — COMPREHENSIVE METABOLIC PANEL
ALT: 9 IU/L (ref 0–44)
AST: 14 IU/L (ref 0–40)
Albumin/Globulin Ratio: 2 (ref 1.2–2.2)
Albumin: 4.3 g/dL (ref 3.8–4.8)
Alkaline Phosphatase: 57 IU/L (ref 39–117)
BUN/Creatinine Ratio: 18 (ref 10–24)
BUN: 18 mg/dL (ref 8–27)
Bilirubin Total: 0.5 mg/dL (ref 0.0–1.2)
CO2: 23 mmol/L (ref 20–29)
Calcium: 9.3 mg/dL (ref 8.6–10.2)
Chloride: 102 mmol/L (ref 96–106)
Creatinine, Ser: 0.98 mg/dL (ref 0.76–1.27)
GFR calc Af Amer: 91 mL/min/{1.73_m2} (ref >59)
GFR calc non Af Amer: 79 mL/min/{1.73_m2} (ref >59)
Globulin, Total: 2.1 g/dL (ref 1.5–4.5)
Glucose: 89 mg/dL (ref 65–99)
Potassium: 4.2 mmol/L (ref 3.5–5.2)
Sodium: 139 mmol/L (ref 134–144)
Total Protein: 6.4 g/dL (ref 6.0–8.5)

## 2020-01-29 LAB — TESTT+TESTF+SHBG
Sex Hormone Binding: 47.3 nmol/L (ref 19.3–76.4)
Testosterone, Free: 3.8 pg/mL — ABNORMAL LOW (ref 6.6–18.1)
Testosterone, Total, LC/MS: 432.5 ng/dL (ref 264.0–916.0)

## 2020-01-29 LAB — VITAMIN D 25 HYDROXY (VIT D DEFICIENCY, FRACTURES): Vit D, 25-Hydroxy: 34.4 ng/mL (ref 30.0–100.0)

## 2020-01-29 LAB — VITAMIN B12: Vitamin B-12: 567 pg/mL (ref 232–1245)

## 2020-01-30 ENCOUNTER — Encounter: Payer: Self-pay | Admitting: Emergency Medicine

## 2020-05-30 ENCOUNTER — Other Ambulatory Visit: Payer: Self-pay | Admitting: Emergency Medicine

## 2020-05-30 DIAGNOSIS — E063 Autoimmune thyroiditis: Secondary | ICD-10-CM

## 2020-05-30 NOTE — Telephone Encounter (Signed)
Requested Prescriptions  Pending Prescriptions Disp Refills   levothyroxine (SYNTHROID) 25 MCG tablet [Pharmacy Med Name: MYLAN-LEVOTHYROX 25MCG TABLET] 90 tablet 1    Sig: TAKE 1 TABLET BY MOUTH  DAILY BEFORE BREAKFAST     Endocrinology:  Hypothyroid Agents Failed - 05/30/2020 10:04 PM      Failed - TSH needs to be rechecked within 3 months after an abnormal result. Refill until TSH is due.      Passed - TSH in normal range and within 360 days    TSH  Date Value Ref Range Status  01/26/2020 2.930 0.450 - 4.500 uIU/mL Final         Passed - Valid encounter within last 12 months    Recent Outpatient Visits          4 months ago Hypothyroidism due to Hashimoto's thyroiditis   [facility], Ines Bloomer, MD   9 months ago Routine general medical examination at a health care facility   [facility] [place], Ines Bloomer, MD   1 year ago Routine general medical examination at a health care facility   [facility] [place], Ines Bloomer, MD   2 years ago Hemoglobin decreased   [facility], Audrie Lia, PA-C   2 years ago Annual physical exam   [facility], Audrie Lia, PA-C      Future Appointments            In 2 months [facility], Ines Bloomer, MD [facility] at [place], [facility]

## 2020-06-14 ENCOUNTER — Telehealth: Payer: Self-pay | Admitting: Emergency Medicine

## 2020-06-14 ENCOUNTER — Other Ambulatory Visit: Payer: Self-pay | Admitting: Emergency Medicine

## 2020-06-14 DIAGNOSIS — R5383 Other fatigue: Secondary | ICD-10-CM

## 2020-06-14 DIAGNOSIS — E7800 Pure hypercholesterolemia, unspecified: Secondary | ICD-10-CM

## 2020-06-14 DIAGNOSIS — Z0000 Encounter for general adult medical examination without abnormal findings: Secondary | ICD-10-CM

## 2020-06-14 DIAGNOSIS — Z13228 Encounter for screening for other metabolic disorders: Secondary | ICD-10-CM

## 2020-06-14 NOTE — Telephone Encounter (Signed)
Pt has an upcoming cpe appointment with Sagardia on  09/05/20 and his labs scheduled for 08/31/20. Please put orders in.

## 2020-06-14 NOTE — Telephone Encounter (Signed)
Labs has been ordered.

## 2020-08-02 ENCOUNTER — Encounter: Payer: Self-pay | Admitting: Emergency Medicine

## 2020-08-09 ENCOUNTER — Encounter: Payer: 59 | Admitting: Emergency Medicine

## 2020-08-31 ENCOUNTER — Other Ambulatory Visit: Payer: Self-pay

## 2020-08-31 ENCOUNTER — Ambulatory Visit (INDEPENDENT_AMBULATORY_CARE_PROVIDER_SITE_OTHER): Payer: 59 | Admitting: Emergency Medicine

## 2020-08-31 DIAGNOSIS — Z0000 Encounter for general adult medical examination without abnormal findings: Secondary | ICD-10-CM | POA: Diagnosis not present

## 2020-08-31 DIAGNOSIS — Z1329 Encounter for screening for other suspected endocrine disorder: Secondary | ICD-10-CM

## 2020-08-31 DIAGNOSIS — Z13228 Encounter for screening for other metabolic disorders: Secondary | ICD-10-CM

## 2020-08-31 DIAGNOSIS — Z130 Encounter for screening for diseases of the blood and blood-forming organs and certain disorders involving the immune mechanism: Secondary | ICD-10-CM

## 2020-08-31 DIAGNOSIS — E7800 Pure hypercholesterolemia, unspecified: Secondary | ICD-10-CM | POA: Diagnosis not present

## 2020-08-31 DIAGNOSIS — R5383 Other fatigue: Secondary | ICD-10-CM | POA: Diagnosis not present

## 2020-08-31 LAB — CBC WITH DIFFERENTIAL/PLATELET
Basophils Absolute: 0 10*3/uL (ref 0.0–0.2)
Basos: 1 %
EOS (ABSOLUTE): 0.2 10*3/uL (ref 0.0–0.4)
Eos: 5 %
Hematocrit: 41.8 % (ref 37.5–51.0)
Hemoglobin: 14.1 g/dL (ref 13.0–17.7)
Immature Grans (Abs): 0 10*3/uL (ref 0.0–0.1)
Immature Granulocytes: 0 %
Lymphocytes Absolute: 1.2 10*3/uL (ref 0.7–3.1)
Lymphs: 33 %
MCH: 28.9 pg (ref 26.6–33.0)
MCHC: 33.7 g/dL (ref 31.5–35.7)
MCV: 86 fL (ref 79–97)
Monocytes Absolute: 0.4 10*3/uL (ref 0.1–0.9)
Monocytes: 11 %
Neutrophils Absolute: 1.8 10*3/uL (ref 1.4–7.0)
Neutrophils: 50 %
Platelets: 168 10*3/uL (ref 150–450)
RBC: 4.88 x10E6/uL (ref 4.14–5.80)
RDW: 11.8 % (ref 11.6–15.4)
WBC: 3.5 10*3/uL (ref 3.4–10.8)

## 2020-08-31 LAB — COMPREHENSIVE METABOLIC PANEL
ALT: 9 IU/L (ref 0–44)
AST: 12 IU/L (ref 0–40)
Albumin/Globulin Ratio: 2 (ref 1.2–2.2)
Albumin: 4.4 g/dL (ref 3.8–4.8)
Alkaline Phosphatase: 64 IU/L (ref 44–121)
BUN/Creatinine Ratio: 16 (ref 10–24)
BUN: 17 mg/dL (ref 8–27)
Bilirubin Total: 0.6 mg/dL (ref 0.0–1.2)
CO2: 27 mmol/L (ref 20–29)
Calcium: 9.5 mg/dL (ref 8.6–10.2)
Chloride: 105 mmol/L (ref 96–106)
Creatinine, Ser: 1.09 mg/dL (ref 0.76–1.27)
GFR calc Af Amer: 80 mL/min/{1.73_m2} (ref >59)
GFR calc non Af Amer: 69 mL/min/{1.73_m2} (ref >59)
Globulin, Total: 2.2 g/dL (ref 1.5–4.5)
Glucose: 85 mg/dL (ref 65–99)
Potassium: 4.6 mmol/L (ref 3.5–5.2)
Sodium: 144 mmol/L (ref 134–144)
Total Protein: 6.6 g/dL (ref 6.0–8.5)

## 2020-08-31 LAB — LIPID PANEL
Chol/HDL Ratio: 4 ratio (ref 0.0–5.0)
Cholesterol, Total: 185 mg/dL (ref 100–199)
HDL: 46 mg/dL (ref >39)
LDL Chol Calc (NIH): 124 mg/dL — ABNORMAL HIGH (ref 0–99)
Triglycerides: 82 mg/dL (ref 0–149)
VLDL Cholesterol Cal: 15 mg/dL (ref 5–40)

## 2020-08-31 LAB — HEMOGLOBIN A1C
Est. average glucose Bld gHb Est-mCnc: 114 mg/dL
Hgb A1c MFr Bld: 5.6 % (ref 4.8–5.6)

## 2020-09-05 ENCOUNTER — Encounter: Payer: Self-pay | Admitting: Emergency Medicine

## 2020-09-05 ENCOUNTER — Ambulatory Visit: Payer: 59 | Admitting: Emergency Medicine

## 2020-09-05 ENCOUNTER — Other Ambulatory Visit: Payer: Self-pay

## 2020-09-05 VITALS — BP 117/70 | HR 64 | Temp 97.5°F | Resp 16 | Ht 72.0 in | Wt 200.0 lb

## 2020-09-05 DIAGNOSIS — E063 Autoimmune thyroiditis: Secondary | ICD-10-CM

## 2020-09-05 DIAGNOSIS — E038 Other specified hypothyroidism: Secondary | ICD-10-CM

## 2020-09-05 DIAGNOSIS — Z0000 Encounter for general adult medical examination without abnormal findings: Secondary | ICD-10-CM

## 2020-09-05 DIAGNOSIS — Z0001 Encounter for general adult medical examination with abnormal findings: Secondary | ICD-10-CM

## 2020-09-05 LAB — POCT URINALYSIS DIP (MANUAL ENTRY)
Bilirubin, UA: NEGATIVE
Blood, UA: NEGATIVE
Glucose, UA: NEGATIVE mg/dL
Ketones, POC UA: NEGATIVE mg/dL
Leukocytes, UA: NEGATIVE
Nitrite, UA: NEGATIVE
Protein Ur, POC: NEGATIVE mg/dL
Spec Grav, UA: 1.01 (ref 1.010–1.025)
Urobilinogen, UA: 0.2 E.U./dL
pH, UA: 5.5 (ref 5.0–8.0)

## 2020-09-05 NOTE — Progress Notes (Signed)
Ethan Lee 69 y.o.   Chief Complaint  Patient presents with  . Annual Exam    HISTORY OF PRESENT ILLNESS: This is a 69 y.o. male here for annual exam. Has history of hypothyroidism, otherwise no other chronic medical problems. Healthy male with a healthy lifestyle. Has no complaints or medical concerns today. Recent blood work done last week showed normal CBC, CMP, A1c, and mild elevation of LDL on lipid profile.    HPI   Prior to Admission medications   Medication Sig Start Date End Date Taking? Authorizing Provider  cetirizine (ZYRTEC) 10 MG tablet Take 10 mg by mouth every evening.   Yes [provider]  cholecalciferol (VITAMIN D) 1000 UNITS tablet Take 1,000 Units by mouth daily.   Yes [provider]  levothyroxine (SYNTHROID) 25 MCG tablet TAKE 1 TABLET BY MOUTH  DAILY BEFORE BREAKFAST 05/30/20  Yes Sagardia, Ines Bloomer, MD  loratadine (CLARITIN) 10 MG tablet Take 10 mg by mouth every morning.   Yes [provider]  vitamin B-12 (CYANOCOBALAMIN) 100 MCG tablet Take 100 mcg by mouth daily.   Yes [provider]  vitamin C (ASCORBIC ACID) 500 MG tablet Take 500 mg by mouth daily. Two tabs   Yes [provider]  vitamin E 400 UNIT capsule Take 400 Units by mouth daily.   Yes [provider]    Allergies  Allergen Reactions  . Peanuts [Peanut Oil]   . Shellfish Allergy     Patient Active Problem List   Diagnosis Date Noted  . Thyroid activity decreased 03/15/2016  . Abnormal heart sounds 07/15/2012  . Allergy history, peanuts 06/30/2012    Past Medical History:  Diagnosis Date  . Allergy   . Hypothyroid     Past Surgical History:  Procedure Laterality Date  . KNEE SURGERY     x 4  . TONSILLECTOMY AND ADENOIDECTOMY    . Vocal cord polyps      Social History   Socioeconomic History  . Marital status: Married    Spouse name: Not on file  . Number of children: 2  . Years of education: Not on  file  . Highest education level: Not on file  Occupational History    Employer: [employer]  Tobacco Use  . Smoking status: Never Smoker  . Smokeless tobacco: Never Used  Substance and Sexual Activity  . Alcohol use: Yes    Alcohol/week: 3.0 standard drinks    Types: 3 Glasses of wine per week  . Drug use: No  . Sexual activity: Yes  Other Topics Concern  . Not on file  Social History Narrative   Lives with wife and works at [employer].  One granddaughter.       Social Determinants of Health   Financial Resource Strain:   . Difficulty of Paying Living Expenses: Not on file  Food Insecurity:   . Worried About Charity fundraiser in the Last Year: Not on file  . Ran Out of Food in the Last Year: Not on file  Transportation Needs:   . Lack of Transportation (Medical): Not on file  . Lack of Transportation (Non-Medical): Not on file  Physical Activity:   . Days of Exercise per Week: Not on file  . Minutes of Exercise per Session: Not on file  Stress:   . Feeling of Stress : Not on file  Social Connections:   . Frequency of Communication with Friends and Family: Not on file  . Frequency of  Social Gatherings with Friends and Family: Not on file  . Attends Religious Services: Not on file  . Active Member of Clubs or Organizations: Not on file  . Attends Archivist Meetings: Not on file  . Marital Status: Not on file  Intimate Partner Violence:   . Fear of Current or Ex-Partner: Not on file  . Emotionally Abused: Not on file  . Physically Abused: Not on file  . Sexually Abused: Not on file    Family History  Problem Relation Age of Onset  . Cancer Mother        COLON CANCER  . Leukemia Father   . Other Father        blood disorder  . Cancer Brother        testicular cancer  . Cancer Brother        liver/kidney cancer     Review of Systems  Constitutional: Negative.  Negative for chills and fever.  HENT: Negative.  Negative for congestion and sore throat.    Respiratory: Negative.  Negative for cough and shortness of breath.   Cardiovascular: Negative.  Negative for chest pain and palpitations.  Gastrointestinal: Negative.  Negative for abdominal pain, diarrhea, nausea and vomiting.  Genitourinary: Negative.  Negative for dysuria and hematuria.  Skin: Negative.  Negative for rash.  Neurological: Negative.  Negative for dizziness and headaches.  All other systems reviewed and are negative.  Today's Vitals   09/05/20 1105  BP: 117/70  Pulse: 64  Resp: 16  Temp: (!) 97.5 F (36.4 C)  TempSrc: Temporal  SpO2: 96%  Weight: 200 lb (90.7 kg)  Height: 6' (1.829 m)   Body mass index is 27.12 kg/m.   Physical Exam Vitals reviewed.  Constitutional:      Appearance: Normal appearance.  HENT:     Head: Normocephalic.     Mouth/Throat:     Mouth: Mucous membranes are moist.     Pharynx: Oropharynx is clear.  Eyes:     Extraocular Movements: Extraocular movements intact.     Conjunctiva/sclera: Conjunctivae normal.     Pupils: Pupils are equal, round, and reactive to light.  Cardiovascular:     Rate and Rhythm: Normal rate and regular rhythm.     Pulses: Normal pulses.     Heart sounds: Normal heart sounds.  Pulmonary:     Effort: Pulmonary effort is normal.     Breath sounds: Normal breath sounds.  Abdominal:     General: Bowel sounds are normal. There is no distension.     Palpations: Abdomen is soft.     Tenderness: There is no abdominal tenderness.  Musculoskeletal:        General: Normal range of motion.     Cervical back: Normal range of motion and neck supple.  Skin:    General: Skin is warm and dry.     Capillary Refill: Capillary refill takes less than 2 seconds.  Neurological:     General: No focal deficit present.     Mental Status: He is alert and oriented to person, place, and time.  Psychiatric:        Mood and Affect: Mood normal.        Behavior: Behavior normal.      ASSESSMENT & PLAN: Papa was seen  today for annual exam.  Diagnoses and all orders for this visit:  Routine general medical examination at a health care facility -     POCT urinalysis dipstick  Hypothyroidism due to  Hashimoto's thyroiditis    Patient Instructions       If you have lab work done today you will be contacted with your lab results within the next 2 weeks.  If you have not heard from [place] then please contact us. The fastest way to get your results is to register for My Chart.   IF you received an x-ray today, you will receive an invoice from [facility]. Please contact [facility] at [phone] with questions or concerns regarding your invoice.   IF you received labwork today, you will receive an invoice from [facility]. Please contact [facility] at [phone] with questions or concerns regarding your invoice.   Our billing staff will not be able to assist you with questions regarding bills from these companies.  You will be contacted with the lab results as soon as they are available. The fastest way to get your results is to activate your My Chart account. Instructions are located on the last page of this paperwork. If you have not heard from [place] regarding the results in 2 weeks, please contact this office.     Health Maintenance After Age 52 After age 17, you are at a higher risk for certain long-term diseases and infections as well as injuries from falls. Falls are a major cause of broken bones and head injuries in people who are older than age 75. Getting regular preventive care can help to keep you healthy and well. Preventive care includes getting regular testing and making lifestyle changes as recommended by your health care provider. Talk with your health care provider about:  Which screenings and tests you should have. A screening is a test that checks for a disease when you have no symptoms.  A diet and exercise plan that is right for you. What should I know about screenings  and tests to prevent falls? Screening and testing are the best ways to find a health problem early. Early diagnosis and treatment give you the best chance of managing medical conditions that are common after age 45. Certain conditions and lifestyle choices may make you more likely to have a fall. Your health care provider may recommend:  Regular vision checks. Poor vision and conditions such as cataracts can make you more likely to have a fall. If you wear glasses, make sure to get your prescription updated if your vision changes.  Medicine review. Work with your health care provider to regularly review all of the medicines you are taking, including over-the-counter medicines. Ask your health care provider about any side effects that may make you more likely to have a fall. Tell your health care provider if any medicines that you take make you feel dizzy or sleepy.  Osteoporosis screening. Osteoporosis is a condition that causes the bones to get weaker. This can make the bones weak and cause them to break more easily.  Blood pressure screening. Blood pressure changes and medicines to control blood pressure can make you feel dizzy.  Strength and balance checks. Your health care provider may recommend certain tests to check your strength and balance while standing, walking, or changing positions.  Foot health exam. Foot pain and numbness, as well as not wearing proper footwear, can make you more likely to have a fall.  Depression screening. You may be more likely to have a fall if you have a fear of falling, feel emotionally low, or feel unable to do activities that you used to do.  Alcohol use screening. Using too much alcohol can  affect your balance and may make you more likely to have a fall. What actions can I take to lower my risk of falls? General instructions  Talk with your health care provider about your risks for falling. Tell your health care provider if: ? You fall. Be sure to tell  your health care provider about all falls, even ones that seem minor. ? You feel dizzy, sleepy, or off-balance.  Take over-the-counter and prescription medicines only as told by your health care provider. These include any supplements.  Eat a healthy diet and maintain a healthy weight. A healthy diet includes low-fat dairy products, low-fat (lean) meats, and fiber from whole grains, beans, and lots of fruits and vegetables. Home safety  Remove any tripping hazards, such as rugs, cords, and clutter.  Install safety equipment such as grab bars in bathrooms and safety rails on stairs.  Keep rooms and walkways well-lit. Activity   Follow a regular exercise program to stay fit. This will help you maintain your balance. Ask your health care provider what types of exercise are appropriate for you.  If you need a cane or walker, use it as recommended by your health care provider.  Wear supportive shoes that have nonskid soles. Lifestyle  Do not drink alcohol if your health care provider tells you not to drink.  If you drink alcohol, limit how much you have: ? 0-1 drink a day for women. ? 0-2 drinks a day for men.  Be aware of how much alcohol is in your drink. In the [place] one drink equals one typical bottle of beer (12 oz), one-half glass of wine (5 oz), or one shot of hard liquor (1 oz).  Do not use any products that contain nicotine or tobacco, such as cigarettes and e-cigarettes. If you need help quitting, ask your health care provider. Summary  Having a healthy lifestyle and getting preventive care can help to protect your health and wellness after age 21.  Screening and testing are the best way to find a health problem early and help you avoid having a fall. Early diagnosis and treatment give you the best chance for managing medical conditions that are more common for people who are older than age 66.  Falls are a major cause of broken bones and head injuries in people who are  older than age 12. Take precautions to prevent a fall at home.  Work with your health care provider to learn what changes you can make to improve your health and wellness and to prevent falls. This information is not intended to replace advice given to you by your health care provider. Make sure you discuss any questions you have with your health care provider. Document Revised: 01/08/2019 Document Reviewed: 07/31/2017 Elsevier Patient Education  2020 Elsevier Inc.      Agustina Caroli, MD [facility]

## 2020-09-05 NOTE — Patient Instructions (Addendum)
   If you have lab work done today you will be contacted with your lab results within the next 2 weeks.  If you have not heard from us then please contact us. The fastest way to get your results is to register for My Chart.   IF you received an x-ray today, you will receive an invoice from [facility]. Please contact [facility] at [phone] with questions or concerns regarding your invoice.   IF you received labwork today, you will receive an invoice from [facility]. Please contact [facility] at [phone] with questions or concerns regarding your invoice.   Our billing staff will not be able to assist you with questions regarding bills from these companies.  You will be contacted with the lab results as soon as they are available. The fastest way to get your results is to activate your My Chart account. Instructions are located on the last page of this paperwork. If you have not heard from us regarding the results in 2 weeks, please contact this office.     Health Maintenance After Age 65 After age 65, you are at a higher risk for certain long-term diseases and infections as well as injuries from falls. Falls are a major cause of broken bones and head injuries in people who are older than age 65. Getting regular preventive care can help to keep you healthy and well. Preventive care includes getting regular testing and making lifestyle changes as recommended by your health care provider. Talk with your health care provider about:  Which screenings and tests you should have. A screening is a test that checks for a disease when you have no symptoms.  A diet and exercise plan that is right for you. What should I know about screenings and tests to prevent falls? Screening and testing are the best ways to find a health problem early. Early diagnosis and treatment give you the best chance of managing medical conditions that are common after age 65. Certain conditions and  lifestyle choices may make you more likely to have a fall. Your health care provider may recommend:  Regular vision checks. Poor vision and conditions such as cataracts can make you more likely to have a fall. If you wear glasses, make sure to get your prescription updated if your vision changes.  Medicine review. Work with your health care provider to regularly review all of the medicines you are taking, including over-the-counter medicines. Ask your health care provider about any side effects that may make you more likely to have a fall. Tell your health care provider if any medicines that you take make you feel dizzy or sleepy.  Osteoporosis screening. Osteoporosis is a condition that causes the bones to get weaker. This can make the bones weak and cause them to break more easily.  Blood pressure screening. Blood pressure changes and medicines to control blood pressure can make you feel dizzy.  Strength and balance checks. Your health care provider may recommend certain tests to check your strength and balance while standing, walking, or changing positions.  Foot health exam. Foot pain and numbness, as well as not wearing proper footwear, can make you more likely to have a fall.  Depression screening. You may be more likely to have a fall if you have a fear of falling, feel emotionally low, or feel unable to do activities that you used to do.  Alcohol use screening. Using too much alcohol can affect your balance and may make you more likely to   have a fall. What actions can I take to lower my risk of falls? General instructions  Talk with your health care provider about your risks for falling. Tell your health care provider if: ? You fall. Be sure to tell your health care provider about all falls, even ones that seem minor. ? You feel dizzy, sleepy, or off-balance.  Take over-the-counter and prescription medicines only as told by your health care provider. These include any  supplements.  Eat a healthy diet and maintain a healthy weight. A healthy diet includes low-fat dairy products, low-fat (lean) meats, and fiber from whole grains, beans, and lots of fruits and vegetables. Home safety  Remove any tripping hazards, such as rugs, cords, and clutter.  Install safety equipment such as grab bars in bathrooms and safety rails on stairs.  Keep rooms and walkways well-lit. Activity   Follow a regular exercise program to stay fit. This will help you maintain your balance. Ask your health care provider what types of exercise are appropriate for you.  If you need a cane or walker, use it as recommended by your health care provider.  Wear supportive shoes that have nonskid soles. Lifestyle  Do not drink alcohol if your health care provider tells you not to drink.  If you drink alcohol, limit how much you have: ? 0-1 drink a day for women. ? 0-2 drinks a day for men.  Be aware of how much alcohol is in your drink. In the [place] one drink equals one typical bottle of beer (12 oz), one-half glass of wine (5 oz), or one shot of hard liquor (1 oz).  Do not use any products that contain nicotine or tobacco, such as cigarettes and e-cigarettes. If you need help quitting, ask your health care provider. Summary  Having a healthy lifestyle and getting preventive care can help to protect your health and wellness after age 65.  Screening and testing are the best way to find a health problem early and help you avoid having a fall. Early diagnosis and treatment give you the best chance for managing medical conditions that are more common for people who are older than age 65.  Falls are a major cause of broken bones and head injuries in people who are older than age 65. Take precautions to prevent a fall at home.  Work with your health care provider to learn what changes you can make to improve your health and wellness and to prevent falls. This information is not intended  to replace advice given to you by your health care provider. Make sure you discuss any questions you have with your health care provider. Document Revised: 01/08/2019 Document Reviewed: 07/31/2017 Elsevier Patient Education  2020 Elsevier Inc.  

## 2020-09-06 LAB — SPECIMEN STATUS REPORT

## 2020-09-06 LAB — TSH: TSH: 4.8 u[IU]/mL — ABNORMAL HIGH (ref 0.450–4.500)

## 2020-09-07 ENCOUNTER — Other Ambulatory Visit: Payer: Self-pay | Admitting: Emergency Medicine

## 2020-09-11 ENCOUNTER — Encounter: Payer: Self-pay | Admitting: Emergency Medicine

## 2020-10-27 ENCOUNTER — Other Ambulatory Visit: Payer: 59

## 2020-10-27 DIAGNOSIS — Z20822 Contact with and (suspected) exposure to covid-19: Secondary | ICD-10-CM

## 2020-10-28 LAB — NOVEL CORONAVIRUS, NAA: SARS-CoV-2, NAA: DETECTED — AB

## 2020-10-28 LAB — SARS-COV-2, NAA 2 DAY TAT

## 2020-11-01 ENCOUNTER — Telehealth: Payer: Self-pay | Admitting: Family Medicine

## 2020-11-01 NOTE — Telephone Encounter (Signed)
Answering service call.  positive covid test few days ago. Sore in neck, may be from working at desk? No dyspnea, chest pain. No HA.  Tx:  Tylenol, gentle of range of motion, ER precauitons overnight, recommend virtual visit tomorrow.

## 2020-11-02 ENCOUNTER — Telehealth (INDEPENDENT_AMBULATORY_CARE_PROVIDER_SITE_OTHER): Payer: 59 | Admitting: Emergency Medicine

## 2020-11-02 ENCOUNTER — Other Ambulatory Visit: Payer: Self-pay

## 2020-11-02 ENCOUNTER — Encounter: Payer: Self-pay | Admitting: Emergency Medicine

## 2020-11-02 VITALS — Ht 73.0 in | Wt 195.0 lb

## 2020-11-02 DIAGNOSIS — M62838 Other muscle spasm: Secondary | ICD-10-CM

## 2020-11-02 DIAGNOSIS — M542 Cervicalgia: Secondary | ICD-10-CM

## 2020-11-02 DIAGNOSIS — E038 Other specified hypothyroidism: Secondary | ICD-10-CM

## 2020-11-02 DIAGNOSIS — E063 Autoimmune thyroiditis: Secondary | ICD-10-CM | POA: Diagnosis not present

## 2020-11-02 MED ORDER — CYCLOBENZAPRINE HCL 10 MG PO TABS: 10.0000 mg | ORAL_TABLET | Freq: Three times a day (TID) | ORAL | 0 refills | Status: DC | PRN

## 2020-11-02 MED ORDER — LEVOTHYROXINE SODIUM 50 MCG PO TABS: 50.0000 ug | ORAL_TABLET | Freq: Every day | ORAL | 3 refills | Status: DC

## 2020-11-02 NOTE — Telephone Encounter (Signed)
Virtual scheduled for Sagardia.

## 2020-11-02 NOTE — Progress Notes (Signed)
Telemedicine Encounter- SOAP NOTE Established Patient Patient: Home  Provider: Office     This telephone encounter was conducted with the patient's (or proxy's) verbal consent via audio telecommunications: yes/no: Yes Patient was instructed to have this encounter in a suitably private space; and to only have persons present to whom they give permission to participate. In addition, patient identity was confirmed by use of name plus two identifiers (DOB and address).  I discussed the limitations, risks, security and privacy concerns of performing an evaluation and management service by telephone and the availability of in person appointments. I also discussed with the patient that there may be a patient responsible charge related to this service. The patient expressed understanding and agreed to proceed.  I spent a total of TIME; 0 MIN TO 60 MIN: 20 minutes talking with the patient or their proxy.  Chief Complaint  Patient presents with  . Neck Pain    Monday stiff neck on the right side. Yesterday pain moved to the left side of neck and shoulder. Have tried Ice and tylenol  . Medication Refill    Need a refill on synthroid 81mcg . Rx need to be updated to the increased dose of the 50 mcg. Rx has been pended and changed    Subjective   Ethan Lee is a 70 y.o. male established patient. Telephone visit today complaining of stiff neck and pain since last Monday.  Denies injury.  No other associated symptoms. Also requesting refill on Synthroid 50 mcg. Recent Covid infection.  Vaccinated. No other complaints or medical concerns.  HPI   Patient Active Problem List   Diagnosis Date Noted  . Thyroid activity decreased 03/15/2016  . Abnormal heart sounds 07/15/2012  . Allergy history, peanuts 06/30/2012    Past Medical History:  Diagnosis Date  . Allergy   . Hypothyroid     Current Outpatient Medications  Medication Sig Dispense Refill  . cetirizine (ZYRTEC) 10 MG tablet  Take 10 mg by mouth every evening.    . cholecalciferol (VITAMIN D) 1000 UNITS tablet Take 1,000 Units by mouth daily.    Marland Kitchen levothyroxine (SYNTHROID) 25 MCG tablet TAKE 1 TABLET BY MOUTH  DAILY BEFORE BREAKFAST 90 tablet 1  . loratadine (CLARITIN) 10 MG tablet Take 10 mg by mouth every morning.    . vitamin B-12 (CYANOCOBALAMIN) 100 MCG tablet Take 100 mcg by mouth daily.    . vitamin C (ASCORBIC ACID) 500 MG tablet Take 500 mg by mouth daily. Two tabs    . vitamin E 400 UNIT capsule Take 400 Units by mouth daily.     No current facility-administered medications for this visit.    Allergies  Allergen Reactions  . Shellfish Allergy     Social History   Socioeconomic History  . Marital status: Married    Spouse name: Not on file  . Number of children: 2  . Years of education: Not on file  . Highest education level: Not on file  Occupational History    Employer: [employer]  Tobacco Use  . Smoking status: Never Smoker  . Smokeless tobacco: Never Used  Substance and Sexual Activity  . Alcohol use: Yes    Alcohol/week: 3.0 standard drinks    Types: 3 Glasses of wine per week  . Drug use: No  . Sexual activity: Yes  Other Topics Concern  . Not on file  Social History Narrative   Lives with wife and works at [employer].  One granddaughter.  Social Determinants of Health   Financial Resource Strain: Not on file  Food Insecurity: Not on file  Transportation Needs: Not on file  Physical Activity: Not on file  Stress: Not on file  Social Connections: Not on file  Intimate Partner Violence: Not on file    Review of Systems  Constitutional: Negative.  Negative for chills and fever.  HENT: Negative.  Negative for congestion and sore throat.   Respiratory: Negative.  Negative for cough and shortness of breath.   Cardiovascular: Negative.  Negative for chest pain and palpitations.  Gastrointestinal: Negative for abdominal pain, diarrhea, nausea and vomiting.  Genitourinary:  Negative.   Musculoskeletal: Positive for neck pain.  Skin: Negative.  Negative for rash.  Neurological: Negative.  Negative for dizziness and headaches.  All other systems reviewed and are negative.   Objective  Alert and oriented x3 no apparent respiratory distress Vitals as reported by the patient: Today's Vitals   11/02/20 0944  Weight: 195 lb (88.5 kg)  Height: 6\' 1"  (1.854 m)    Ethan Lee was seen today for neck pain and medication refill.  Diagnoses and all orders for this visit:  Musculoskeletal neck pain  Hypothyroidism due to Hashimoto's thyroiditis -     levothyroxine (SYNTHROID) 50 MCG tablet; Take 1 tablet (50 mcg total) by mouth daily before breakfast.  Neck muscle spasm -     cyclobenzaprine (FLEXERIL) 10 MG tablet; Take 1 tablet (10 mg total) by mouth 3 (three) times daily as needed for muscle spasms.  Clinically stable.  No red flag signs or symptoms.  No medical concerns identified during this visit. Medications as prescribed. Advised to contact the office if no better or worse during the next several days.    I discussed the assessment and treatment plan with the patient. The patient was provided an opportunity to ask questions and all were answered. The patient agreed with the plan and demonstrated an understanding of the instructions.   The patient was advised to call back or seek an in-person evaluation if the symptoms worsen or if the condition fails to improve as anticipated.  I provided 20 minutes of non-face-to-face time during this encounter.  Horald Pollen, MD  Primary Care at [facility]

## 2020-11-02 NOTE — Patient Instructions (Signed)
° ° ° °  If you have lab work done today you will be contacted with your lab results within the next 2 weeks.  If you have not heard from us then please contact us. The fastest way to get your results is to register for My Chart. ° ° °IF you received an x-ray today, you will receive an invoice from [facility]. Please contact [facility] at [phone] with questions or concerns regarding your invoice.  ° °IF you received labwork today, you will receive an invoice from [facility]. Please contact [facility] at [phone] with questions or concerns regarding your invoice.  ° °Our billing staff will not be able to assist you with questions regarding bills from these companies. ° °You will be contacted with the lab results as soon as they are available. The fastest way to get your results is to activate your My Chart account. Instructions are located on the last page of this paperwork. If you have not heard from us regarding the results in 2 weeks, please contact this office. °  ° ° ° °

## 2020-11-03 ENCOUNTER — Telehealth: Payer: Self-pay

## 2020-11-03 NOTE — Telephone Encounter (Signed)
CRM note. Neck pain. Pt had telehealth 11/02/2020

## 2020-11-05 ENCOUNTER — Telehealth: Payer: Self-pay | Admitting: Family Medicine

## 2020-11-05 NOTE — Telephone Encounter (Signed)
Call from answering service. Diagnosed with Covid approximately week ago. Has had persistent cough. Was seen by video visit few days ago for neck pain and stiffness that was radiating to his arm. Prescribed Flexeril. He has been taking that as well as Tylenol and ibuprofen without relief, 8 out of 10 pain. He is also having chest pain with cough and dyspnea on exertion. No home O2 sat monitoring. Disposition from nurse was ER evaluation. I recommend  ER or urgent care evaluation this morning to assess his dyspnea and chest symptoms given Covid infection. Potentially could have cervical radiculopathy from neck symptoms, may need prednisone but will have evaluation through urgent care/ER as above

## 2021-05-24 ENCOUNTER — Telehealth: Payer: Self-pay | Admitting: Emergency Medicine

## 2021-05-24 DIAGNOSIS — Z0000 Encounter for general adult medical examination without abnormal findings: Secondary | ICD-10-CM

## 2021-05-24 NOTE — Telephone Encounter (Signed)
Requesting his labs for physical to be ordered to [facility] instead. His job covers the labs if they are ordered there.   Please advise.   [facility]:  [address]  [place], [place], [address]

## 2021-05-26 NOTE — Telephone Encounter (Signed)
Placed future lab orders for [facility]. Called pt to inform him, no answer, left VM. Advised pt to get lab work done closer to appointment time.

## 2021-05-27 NOTE — Telephone Encounter (Signed)
Thank you :)

## 2021-06-08 NOTE — Telephone Encounter (Signed)
   Patient calling to confirm  lab order sent to [facility]

## 2021-06-09 NOTE — Telephone Encounter (Signed)
Called and spoke with pt

## 2021-06-16 ENCOUNTER — Other Ambulatory Visit: Payer: Self-pay

## 2021-06-16 ENCOUNTER — Telehealth: Payer: Self-pay

## 2021-06-16 DIAGNOSIS — Z0000 Encounter for general adult medical examination without abnormal findings: Secondary | ICD-10-CM

## 2021-06-16 NOTE — Telephone Encounter (Signed)
Faxed lab orders to [facility], per pt request.

## 2021-06-16 NOTE — Telephone Encounter (Signed)
Patient is requesting lab orders to be in for [facility].   [phone] Fax   [address] [place], [address]

## 2021-06-24 LAB — COMPREHENSIVE METABOLIC PANEL
ALT: 11 IU/L (ref 0–44)
AST: 15 IU/L (ref 0–40)
Albumin/Globulin Ratio: 1.9 (ref 1.2–2.2)
Albumin: 4.4 g/dL (ref 3.8–4.8)
Alkaline Phosphatase: 66 IU/L (ref 44–121)
BUN/Creatinine Ratio: 18 (ref 10–24)
BUN: 21 mg/dL (ref 8–27)
Bilirubin Total: 0.4 mg/dL (ref 0.0–1.2)
CO2: 25 mmol/L (ref 20–29)
Calcium: 9.4 mg/dL (ref 8.6–10.2)
Chloride: 103 mmol/L (ref 96–106)
Creatinine, Ser: 1.15 mg/dL (ref 0.76–1.27)
Globulin, Total: 2.3 g/dL (ref 1.5–4.5)
Glucose: 87 mg/dL (ref 65–99)
Potassium: 4.6 mmol/L (ref 3.5–5.2)
Sodium: 143 mmol/L (ref 134–144)
Total Protein: 6.7 g/dL (ref 6.0–8.5)
eGFR: 68 mL/min/{1.73_m2} (ref >59)

## 2021-06-24 LAB — LIPID PANEL
Chol/HDL Ratio: 3.8 ratio (ref 0.0–5.0)
Cholesterol, Total: 155 mg/dL (ref 100–199)
HDL: 41 mg/dL (ref >39)
LDL Chol Calc (NIH): 101 mg/dL — ABNORMAL HIGH (ref 0–99)
Triglycerides: 66 mg/dL (ref 0–149)
VLDL Cholesterol Cal: 13 mg/dL (ref 5–40)

## 2021-06-24 LAB — CBC WITH DIFFERENTIAL/PLATELET
Basophils Absolute: 0 10*3/uL (ref 0.0–0.2)
Basos: 1 %
EOS (ABSOLUTE): 0.2 10*3/uL (ref 0.0–0.4)
Eos: 5 %
Hematocrit: 45 % (ref 37.5–51.0)
Hemoglobin: 15.5 g/dL (ref 13.0–17.7)
Immature Grans (Abs): 0 10*3/uL (ref 0.0–0.1)
Immature Granulocytes: 0 %
Lymphocytes Absolute: 1.6 10*3/uL (ref 0.7–3.1)
Lymphs: 35 %
MCH: 28.9 pg (ref 26.6–33.0)
MCHC: 34.4 g/dL (ref 31.5–35.7)
MCV: 84 fL (ref 79–97)
Monocytes Absolute: 0.4 10*3/uL (ref 0.1–0.9)
Monocytes: 10 %
Neutrophils Absolute: 2.3 10*3/uL (ref 1.4–7.0)
Neutrophils: 49 %
Platelets: 168 10*3/uL (ref 150–450)
RBC: 5.36 x10E6/uL (ref 4.14–5.80)
RDW: 12.1 % (ref 11.6–15.4)
WBC: 4.6 10*3/uL (ref 3.4–10.8)

## 2021-06-24 LAB — HEMOGLOBIN A1C
Est. average glucose Bld gHb Est-mCnc: 117 mg/dL
Hgb A1c MFr Bld: 5.7 % — ABNORMAL HIGH (ref 4.8–5.6)

## 2021-06-24 LAB — TSH: TSH: 0.097 u[IU]/mL — ABNORMAL LOW (ref 0.450–4.500)

## 2021-07-18 ENCOUNTER — Encounter: Payer: Self-pay | Admitting: Emergency Medicine

## 2021-08-02 ENCOUNTER — Ambulatory Visit: Payer: 59 | Admitting: Cardiology

## 2021-08-02 ENCOUNTER — Encounter: Payer: Self-pay | Admitting: Cardiology

## 2021-08-02 ENCOUNTER — Other Ambulatory Visit: Payer: Self-pay

## 2021-08-02 VITALS — BP 125/88 | HR 78 | Ht 73.0 in | Wt 190.0 lb

## 2021-08-02 DIAGNOSIS — I7121 Aneurysm of the ascending aorta, without rupture: Secondary | ICD-10-CM | POA: Diagnosis not present

## 2021-08-02 DIAGNOSIS — I4891 Unspecified atrial fibrillation: Secondary | ICD-10-CM

## 2021-08-02 DIAGNOSIS — R002 Palpitations: Secondary | ICD-10-CM | POA: Diagnosis not present

## 2021-08-02 MED ORDER — RIVAROXABAN 20 MG PO TABS: 20.0000 mg | ORAL_TABLET | Freq: Every day | ORAL | 2 refills | Status: DC

## 2021-08-02 NOTE — Patient Instructions (Signed)
Medication Instructions:  Start Xarelto 20 mg daily   *If you need a refill on your cardiac medications before your next appointment, please call your pharmacy*   Testing/Procedures: Echocardiogram - Your physician has requested that you have an echocardiogram. Echocardiography is a painless test that uses sound waves to create images of your heart. It provides your doctor with information about the size and shape of your heart and how well your heart's chambers and valves are working. This procedure takes approximately one hour. There are no restrictions for this procedure. This will be performed at our [facility] location - [address].    Follow-Up: At [facility], you and your health needs are our priority.  As part of our continuing mission to provide you with exceptional heart care, we have created designated Provider Care Teams.  These Care Teams include your primary Cardiologist (physician) and Advanced Practice Providers (APPs -  Physician Assistants and Nurse Practitioners) who all work together to provide you with the care you need, when you need it.  We recommend signing up for the patient portal called "MyChart".  Sign up information is provided on this After Visit Summary.  MyChart is used to connect with patients for Virtual Visits (Telemedicine).  Patients are able to view lab/test results, encounter notes, upcoming appointments, etc.  Non-urgent messages can be sent to your provider as well.   To learn more about what you can do with MyChart, go to [email or web address].    Your next appointment:   1 month(s)  The format for your next appointment:   In Person  Provider:   Minus Breeding, MD

## 2021-08-02 NOTE — Progress Notes (Signed)
Cardiology Office Note   Date:  08/02/2021   ID:  Ethan Lee, DOB 03-21-1951, MRN [id number]  PCP:  Horald Pollen, MD  Cardiologist:   Minus Breeding, MD  Referring:  Horald Pollen, *   Chief Complaint  Patient presents with   Atrial Fibrillation       History of Present Illness: Ethan Lee is a 70 y.o. male who presents for evaluation of palpitations.  I saw him in 2013 for evaluation of possible mitral valve click and in 2423 for palpitations.  He was noted recently during an orthopedic appointment to have an irregular heartbeat.  There was no EKG but today he is found to be in atrial fibrillation.  He would not really feel this.  He is not having any palpitations, presyncope or syncope.  He does not have any fatigue or shortness of breath.  He is not having any chest pressure, neck or arm discomfort.  I do notice that in late September his TSH was 0.097 and he has not yet had this addressed with the change in his Synthroid.   Past Medical History:  Diagnosis Date   Allergy    Hypothyroid     Past Surgical History:  Procedure Laterality Date   KNEE SURGERY     x 4   TONSILLECTOMY AND ADENOIDECTOMY     Vocal cord polyps       Current Outpatient Medications  Medication Sig Dispense Refill   cetirizine (ZYRTEC) 10 MG tablet Take 10 mg by mouth every evening.     Cetirizine HCl (ZYRTEC ALLERGY) 10 MG CAPS one     cholecalciferol (VITAMIN D) 1000 UNITS tablet Take 1,000 Units by mouth daily.     Cyanocobalamin (VITAMIN B 12) 100 MCG LOZG      cyclobenzaprine (FLEXERIL) 10 MG tablet Take 1 tablet (10 mg total) by mouth 3 (three) times daily as needed for muscle spasms. 30 tablet 0   EPINEPHrine 0.3 mg/0.3 mL IJ SOAJ injection epinephrine 0.3 mg/0.3 mL injection, auto-injector  AS DIRECTED FOR SYSTEMIC REACTION INJECTION AS NEEDED 30 DAYS     levothyroxine (SYNTHROID) 50 MCG tablet Take 1 tablet (50 mcg total) by mouth daily before breakfast.  90 tablet 3   loratadine (CLARITIN) 10 MG tablet Take 10 mg by mouth every morning.     rivaroxaban (XARELTO) 20 MG TABS tablet Take 1 tablet (20 mg total) by mouth daily with supper. 30 tablet 2   vitamin B-12 (CYANOCOBALAMIN) 100 MCG tablet Take 100 mcg by mouth daily.     vitamin C (ASCORBIC ACID) 500 MG tablet Take 500 mg by mouth daily. Two tabs     vitamin C (ASCORBIC ACID) 500 MG tablet 1 tablet     vitamin E 400 UNIT capsule Take 400 Units by mouth daily.     No current facility-administered medications for this visit.    Allergies:   Shellfish allergy    Social History:  The patient  reports that he has never smoked. He has never used smokeless tobacco. He reports current alcohol use of about 3.0 standard drinks per week. He reports that he does not use drugs.   Family History:  The patient's family history includes Cancer in his brother, brother, and mother; Leukemia in his father.   ROS:  Please see the history of present illness.   Otherwise, review of systems are positive for none.   All other systems are reviewed and negative.    PHYSICAL EXAM: VS:  BP 125/88   Pulse 78   Ht 6\' 1"  (1.854 m)   Wt 190 lb (86.2 kg)   SpO2 99%   BMI 25.07 kg/m  , BMI Body mass index is 25.07 kg/m. GENERAL:  Well appearing HEENT:  Pupils equal round and reactive, fundi not visualized, oral mucosa unremarkable NECK:  No jugular venous distention, waveform within normal limits, carotid upstroke brisk and symmetric, no bruits, no thyromegaly LYMPHATICS:  No cervical, inguinal adenopathy LUNGS:  Clear to auscultation bilaterally BACK:  No CVA tenderness CHEST:  Unremarkable HEART:  PMI not displaced or sustained,S1 and S2 within normal limits, no S3, no clicks, no rubs, no murmurs, irregular ABD:  Flat, positive bowel sounds normal in frequency in pitch, no bruits, no rebound, no guarding, no midline pulsatile mass, no hepatomegaly, no splenomegaly EXT:  2 plus pulses throughout, no  edema, no cyanosis no clubbing SKIN:  No rashes no nodules NEURO:  Cranial nerves II through XII grossly intact, motor grossly intact throughout PSYCH:  Cognitively intact, oriented to person place and time   EKG:  EKG is not ordered today. The ekg ordered  demonstrates atrial fibrillation, rate 78, left axis deviation, poor anterior R wave progression, borderline first-degree AV block, no acute ST-T wave changes.  There is no change compared to previous.    Recent Labs: 06/23/2021: ALT 11; BUN 21; Creatinine, Ser 1.15; Hemoglobin 15.5; Platelets 168; Potassium 4.6; Sodium 143; TSH 0.097    Lipid Panel    Component Value Date/Time   CHOL 155 06/23/2021 0716   TRIG 66 06/23/2021 0716   HDL 41 06/23/2021 0716   CHOLHDL 3.8 06/23/2021 0716   CHOLHDL 4.0 07/26/2015 0852   VLDL 20 07/26/2015 0852   LDLCALC 101 (H) 06/23/2021 0716      Wt Readings from Last 3 Encounters:  08/02/21 190 lb (86.2 kg)  11/02/20 195 lb (88.5 kg)  09/05/20 200 lb (90.7 kg)      Other studies Reviewed: Additional studies/ records that were reviewed today include: Labs Review of the above records demonstrates:  Please see elsewhere in the note.     ASSESSMENT AND PLAN:   ATRIAL FIB: The patient has new onset atrial fibrillation although he has not had a recent EKG.  [name] has a CHA2DS2 - VASc score of 1.  I am going to start him on Xarelto.  He is going to get an echocardiogram.  He needs to have his thyroid medications adjusted.  If in a month he is still in atrial fibrillation I will plan to have him wear a 3-day monitor to make sure it is persistent and then proceed with cardioversion.  In the meantime he will be on Xarelto.         Current medicines are reviewed at length with the patient today.  The patient does not have concerns regarding medicines.  The following changes have been made: As above  Labs/ tests ordered today include:   Orders Placed This Encounter  Procedures    ECHOCARDIOGRAM COMPLETE      Disposition:   FU with me 1 month   Signed, Minus Breeding, MD  08/02/2021 4:43 PM    [place]

## 2021-08-04 NOTE — Addendum Note (Signed)
Addended by: Dionicio Stall E on: 08/04/2021 01:00 PM   Modules accepted: Orders

## 2021-08-08 ENCOUNTER — Ambulatory Visit: Payer: 59 | Admitting: Emergency Medicine

## 2021-08-14 ENCOUNTER — Other Ambulatory Visit: Payer: Self-pay

## 2021-08-14 ENCOUNTER — Encounter: Payer: Self-pay | Admitting: Emergency Medicine

## 2021-08-14 ENCOUNTER — Ambulatory Visit: Payer: 59 | Admitting: Emergency Medicine

## 2021-08-14 VITALS — BP 122/70 | HR 65 | Ht 73.0 in | Wt 191.0 lb

## 2021-08-14 DIAGNOSIS — E039 Hypothyroidism, unspecified: Secondary | ICD-10-CM | POA: Diagnosis not present

## 2021-08-14 DIAGNOSIS — I4891 Unspecified atrial fibrillation: Secondary | ICD-10-CM | POA: Diagnosis not present

## 2021-08-14 NOTE — Assessment & Plan Note (Signed)
Clinically euthyroid.  Will decrease dose of Synthroid to 25 mcg daily.

## 2021-08-14 NOTE — Patient Instructions (Signed)
Reduce Synthroid dose to 25 mcg daily. Atrial Fibrillation Atrial fibrillation is a type of heartbeat that is irregular or fast. If you have this condition, your heart beats without any order. This makes it hard for your heart to pump blood in a normal way. Atrial fibrillation may come and go, or it may become a long-lasting problem. If this condition is not treated, it can put you at higher risk for stroke, heart failure, and other heart problems. What are the causes? This condition may be caused by diseases that damage the heart. They include: High blood pressure. Heart failure. Heart valve disease. Heart surgery. Other causes include: Diabetes. Thyroid disease. Being overweight. Kidney disease. Sometimes the cause is not known. What increases the risk? You are more likely to develop this condition if: You are older. You smoke. You exercise often and very hard. You have a family history of this condition. You are a man. You use drugs. You drink a lot of alcohol. You have lung conditions, such as emphysema, pneumonia, or COPD. You have sleep apnea. What are the signs or symptoms? Common symptoms of this condition include: A feeling that your heart is beating very fast. Chest pain or discomfort. Feeling short of breath. Suddenly feeling light-headed or weak. Getting tired easily during activity. Fainting. Sweating. In some cases, there are no symptoms. How is this treated? Treatment for this condition depends on underlying conditions and how you feel when you have atrial fibrillation. They include: Medicines to: Prevent blood clots. Treat heart rate or heart rhythm problems. Using devices, such as a pacemaker, to correct heart rhythm problems. Doing surgery to remove the part of the heart that sends bad signals. Closing an area where clots can form in the heart (left atrial appendage). In some cases, your doctor will treat other underlying conditions. Follow these  instructions at home: Medicines Take over-the-counter and prescription medicines only as told by your doctor. Do not take any new medicines without first talking to your doctor. If you are taking blood thinners: Talk with your doctor before you take any medicines that have aspirin or NSAIDs, such as ibuprofen, in them. Take your medicine exactly as told by your doctor. Take it at the same time each day. Avoid activities that could hurt or bruise you. Follow instructions about how to prevent falls. Wear a bracelet that says you are taking blood thinners. Or, carry a card that lists what medicines you take. Lifestyle   Do not use any products that have nicotine or tobacco in them. These include cigarettes, e-cigarettes, and chewing tobacco. If you need help quitting, ask your doctor. Eat heart-healthy foods. Talk with your doctor about the right eating plan for you. Exercise regularly as told by your doctor. Do not drink alcohol. Lose weight if you are overweight. Do not use drugs, including cannabis. General instructions If you have a condition that causes breathing to stop for a short period of time (apnea), treat it as told by your doctor. Keep a healthy weight. Do not use diet pills unless your doctor says they are safe for you. Diet pills may make heart problems worse. Keep all follow-up visits as told by your doctor. This is important. Contact a doctor if: You notice a change in the speed, rhythm, or strength of your heartbeat. You are taking a blood-thinning medicine and you get more bruising. You get tired more easily when you move or exercise. You have a sudden change in weight. Get help right away if:  You have pain in your chest or your belly (abdomen). You have trouble breathing. You have side effects of blood thinners, such as blood in your vomit, poop (stool), or pee (urine), or bleeding that cannot stop. You have any signs of a stroke. "BE FAST" is an easy way to remember  the main warning signs: B - Balance. Signs are dizziness, sudden trouble walking, or loss of balance. E - Eyes. Signs are trouble seeing or a change in how you see. F - Face. Signs are sudden weakness or loss of feeling in the face, or the face or eyelid drooping on one side. A - Arms. Signs are weakness or loss of feeling in an arm. This happens suddenly and usually on one side of the body. S - Speech. Signs are sudden trouble speaking, slurred speech, or trouble understanding what people say. T - Time. Time to call emergency services. Write down what time symptoms started. You have other signs of a stroke, such as: A sudden, very bad headache with no known cause. Feeling like you may vomit (nausea). Vomiting. A seizure. These symptoms may be an emergency. Do not wait to see if the symptoms will go away. Get medical help right away. Call your local emergency services (911 in the [place] Do not drive yourself to the hospital. Summary Atrial fibrillation is a type of heartbeat that is irregular or fast. You are at higher risk of this condition if you smoke, are older, have diabetes, or are overweight. Follow your doctor's instructions about medicines, diet, exercise, and follow-up visits. Get help right away if you have signs or symptoms of a stroke. Get help right away if you cannot catch your breath, or you have chest pain or discomfort. This information is not intended to replace advice given to you by your health care provider. Make sure you discuss any questions you have with your health care provider. Document Revised: 03/11/2019 Document Reviewed: 03/11/2019 Elsevier Patient Education  [place].

## 2021-08-14 NOTE — Assessment & Plan Note (Signed)
Asymptomatic.  Well-controlled rate.  Has history of the same in the past.  Presently on Xarelto.  Stable. Scheduled for echocardiogram.

## 2021-08-14 NOTE — Progress Notes (Signed)
Ethan Lee 70 y.o.   Chief Complaint  Patient presents with   Follow-up    Discuss thyroid and recent visit with Cardiologist    HISTORY OF PRESENT ILLNESS: This is a 70 y.o. male recently found to be in atrial fibrillation.  Seen and evaluated by cardiologist after orthopedic surgeon detected an irregular rhythm.  Started on Xarelto.  Scheduled for echocardiogram and possible cardioversion. Has history of hypothyroidism.  Was on Synthroid 50 mcg daily.  Most recent TSH  Lab Results  Component Value Date   TSH 0.097 (L) 06/23/2021  Asymptomatic. Most recent cardiology office visit reviewed, assessment and plan as follows: ASSESSMENT AND PLAN:     ATRIAL FIB: The patient has new onset atrial fibrillation although he has not had a recent EKG.  [name] has a CHA2DS2 - VASc score of 1.  I am going to start him on Xarelto.  He is going to get an echocardiogram.  He needs to have his thyroid medications adjusted.  If in a month he is still in atrial fibrillation I will plan to have him wear a 3-day monitor to make sure it is persistent and then proceed with cardioversion.  In the meantime he will be on Xarelto.           Current medicines are reviewed at length with the patient today.  The patient does not have concerns regarding medicines.   The following changes have been made: As above   Labs/ tests ordered today include:       Orders Placed This Encounter  Procedures   ECHOCARDIOGRAM COMPLETE    Disposition:   FU with me 1 month   Signed, Minus Breeding, MD  08/02/2021 4:43 PM    [facility]     HPI   Prior to Admission medications   Medication Sig Start Date End Date Taking? Authorizing Provider  cetirizine (ZYRTEC) 10 MG tablet Take 10 mg by mouth every evening.   Yes [provider]  Cetirizine HCl (ZYRTEC ALLERGY) 10 MG CAPS one   Yes [provider]  cholecalciferol (VITAMIN D) 1000 UNITS tablet Take  1,000 Units by mouth daily.   Yes [provider]  Cyanocobalamin (VITAMIN B 12) 100 MCG LOZG    Yes [provider]  cyclobenzaprine (FLEXERIL) 10 MG tablet Take 1 tablet (10 mg total) by mouth 3 (three) times daily as needed for muscle spasms. 11/02/20  Yes Jalan Fariss, Ines Bloomer, MD  EPINEPHrine 0.3 mg/0.3 mL IJ SOAJ injection epinephrine 0.3 mg/0.3 mL injection, auto-injector  AS DIRECTED FOR SYSTEMIC REACTION INJECTION AS NEEDED 30 DAYS 04/20/21  Yes [provider]  levothyroxine (SYNTHROID) 50 MCG tablet Take 1 tablet (50 mcg total) by mouth daily before breakfast. 11/02/20  Yes Belen Zwahlen, Ines Bloomer, MD  loratadine (CLARITIN) 10 MG tablet Take 10 mg by mouth every morning.   Yes [provider]  rivaroxaban (XARELTO) 20 MG TABS tablet Take 1 tablet (20 mg total) by mouth daily with supper. 08/02/21  Yes Minus Breeding, MD  vitamin B-12 (CYANOCOBALAMIN) 100 MCG tablet Take 100 mcg by mouth daily.   Yes [provider]  vitamin C (ASCORBIC ACID) 500 MG tablet Take 500 mg by mouth daily. Two tabs   Yes [provider]  vitamin C (ASCORBIC ACID) 500 MG tablet 1 tablet   Yes [provider]  vitamin E 400 UNIT capsule Take 400 Units by mouth daily.   Yes [provider]  Allergies  Allergen Reactions   Shellfish Allergy     Patient Active Problem List   Diagnosis Date Noted   Aneurysm of ascending aorta without rupture 08/02/2021   Palpitations 08/02/2021   Thyroid activity decreased 03/15/2016   Abnormal heart sounds 07/15/2012   Allergy history, peanuts 06/30/2012    Past Medical History:  Diagnosis Date   Allergy    Hypothyroid     Past Surgical History:  Procedure Laterality Date   KNEE SURGERY     x 4   TONSILLECTOMY AND ADENOIDECTOMY     Vocal cord polyps      Social History   Socioeconomic History   Marital status: Married    Spouse name: Not on file   Number of children: 2   Years of  education: Not on file   Highest education level: Not on file  Occupational History    Employer: [employer]  Tobacco Use   Smoking status: Never   Smokeless tobacco: Never  Substance and Sexual Activity   Alcohol use: Yes    Alcohol/week: 3.0 standard drinks    Types: 3 Glasses of wine per week   Drug use: No   Sexual activity: Yes  Other Topics Concern   Not on file  Social History Narrative   Lives with wife and works at [employer]  granddaughters.       Social Determinants of Health   Financial Resource Strain: Not on file  Food Insecurity: Not on file  Transportation Needs: Not on file  Physical Activity: Not on file  Stress: Not on file  Social Connections: Not on file  Intimate Partner Violence: Not on file    Family History  Problem Relation Age of Onset   Cancer Mother        COLON CANCER   Leukemia Father    Other Father        blood disorder   Cancer Brother        testicular cancer   Cancer Brother        liver/kidney cancer     Review of Systems  Constitutional: Negative.  Negative for chills and fever.  HENT: Negative.  Negative for congestion.   Respiratory: Negative.  Negative for cough, shortness of breath and stridor.   Cardiovascular: Negative.  Negative for chest pain, palpitations and leg swelling.  Gastrointestinal:  Negative for abdominal pain, nausea and vomiting.  Genitourinary: Negative.   Skin: Negative.  Negative for rash.  Neurological: Negative.  Negative for dizziness and headaches.  All other systems reviewed and are negative.   Physical Exam Vitals reviewed.  Constitutional:      Appearance: Normal appearance.  HENT:     Head: Normocephalic.  Eyes:     Extraocular Movements: Extraocular movements intact.     Pupils: Pupils are equal, round, and reactive to light.  Cardiovascular:     Rate and Rhythm: Normal rate. Rhythm irregular.     Pulses: Normal pulses.     Heart sounds: Normal heart sounds.  Pulmonary:      Effort: Pulmonary effort is normal.     Breath sounds: Normal breath sounds.  Musculoskeletal:     Cervical back: Normal range of motion and neck supple.  Skin:    General: Skin is warm and dry.     Capillary Refill: Capillary refill takes less than 2 seconds.  Neurological:     General: No focal deficit present.     Mental Status: He is alert and  oriented to person, place, and time.  Psychiatric:        Mood and Affect: Mood normal.        Behavior: Behavior normal.     ASSESSMENT & PLAN: Problem List Items Addressed This Visit       Cardiovascular and Mediastinum   Atrial fibrillation ([place])    Asymptomatic.  Well-controlled rate.  Has history of the same in the past.  Presently on Xarelto.  Stable. Scheduled for echocardiogram.        Endocrine   Hypothyroidism - Primary    Clinically euthyroid.  Will decrease dose of Synthroid to 25 mcg daily.      Patient Instructions  Reduce Synthroid dose to 25 mcg daily. Atrial Fibrillation Atrial fibrillation is a type of heartbeat that is irregular or fast. If you have this condition, your heart beats without any order. This makes it hard for your heart to pump blood in a normal way. Atrial fibrillation may come and go, or it may become a long-lasting problem. If this condition is not treated, it can put you at higher risk for stroke, heart failure, and other heart problems. What are the causes? This condition may be caused by diseases that damage the heart. They include: High blood pressure. Heart failure. Heart valve disease. Heart surgery. Other causes include: Diabetes. Thyroid disease. Being overweight. Kidney disease. Sometimes the cause is not known. What increases the risk? You are more likely to develop this condition if: You are older. You smoke. You exercise often and very hard. You have a family history of this condition. You are a man. You use drugs. You drink a lot of alcohol. You have lung conditions,  such as emphysema, pneumonia, or COPD. You have sleep apnea. What are the signs or symptoms? Common symptoms of this condition include: A feeling that your heart is beating very fast. Chest pain or discomfort. Feeling short of breath. Suddenly feeling light-headed or weak. Getting tired easily during activity. Fainting. Sweating. In some cases, there are no symptoms. How is this treated? Treatment for this condition depends on underlying conditions and how you feel when you have atrial fibrillation. They include: Medicines to: Prevent blood clots. Treat heart rate or heart rhythm problems. Using devices, such as a pacemaker, to correct heart rhythm problems. Doing surgery to remove the part of the heart that sends bad signals. Closing an area where clots can form in the heart (left atrial appendage). In some cases, your doctor will treat other underlying conditions. Follow these instructions at home: Medicines Take over-the-counter and prescription medicines only as told by your doctor. Do not take any new medicines without first talking to your doctor. If you are taking blood thinners: Talk with your doctor before you take any medicines that have aspirin or NSAIDs, such as ibuprofen, in them. Take your medicine exactly as told by your doctor. Take it at the same time each day. Avoid activities that could hurt or bruise you. Follow instructions about how to prevent falls. Wear a bracelet that says you are taking blood thinners. Or, carry a card that lists what medicines you take. Lifestyle   Do not use any products that have nicotine or tobacco in them. These include cigarettes, e-cigarettes, and chewing tobacco. If you need help quitting, ask your doctor. Eat heart-healthy foods. Talk with your doctor about the right eating plan for you. Exercise regularly as told by your doctor. Do not drink alcohol. Lose weight if you are overweight. Do not  use drugs, including  cannabis. General instructions If you have a condition that causes breathing to stop for a short period of time (apnea), treat it as told by your doctor. Keep a healthy weight. Do not use diet pills unless your doctor says they are safe for you. Diet pills may make heart problems worse. Keep all follow-up visits as told by your doctor. This is important. Contact a doctor if: You notice a change in the speed, rhythm, or strength of your heartbeat. You are taking a blood-thinning medicine and you get more bruising. You get tired more easily when you move or exercise. You have a sudden change in weight. Get help right away if:  You have pain in your chest or your belly (abdomen). You have trouble breathing. You have side effects of blood thinners, such as blood in your vomit, poop (stool), or pee (urine), or bleeding that cannot stop. You have any signs of a stroke. "BE FAST" is an easy way to remember the main warning signs: B - Balance. Signs are dizziness, sudden trouble walking, or loss of balance. E - Eyes. Signs are trouble seeing or a change in how you see. F - Face. Signs are sudden weakness or loss of feeling in the face, or the face or eyelid drooping on one side. A - Arms. Signs are weakness or loss of feeling in an arm. This happens suddenly and usually on one side of the body. S - Speech. Signs are sudden trouble speaking, slurred speech, or trouble understanding what people say. T - Time. Time to call emergency services. Write down what time symptoms started. You have other signs of a stroke, such as: A sudden, very bad headache with no known cause. Feeling like you may vomit (nausea). Vomiting. A seizure. These symptoms may be an emergency. Do not wait to see if the symptoms will go away. Get medical help right away. Call your local emergency services (911 in the [place] Do not drive yourself to the hospital. Summary Atrial fibrillation is a type of heartbeat that is irregular  or fast. You are at higher risk of this condition if you smoke, are older, have diabetes, or are overweight. Follow your doctor's instructions about medicines, diet, exercise, and follow-up visits. Get help right away if you have signs or symptoms of a stroke. Get help right away if you cannot catch your breath, or you have chest pain or discomfort. This information is not intended to replace advice given to you by your health care provider. Make sure you discuss any questions you have with your health care provider. Document Revised: 03/11/2019 Document Reviewed: 03/11/2019 Elsevier Patient Education  2022 [place], [place] [facility]

## 2021-08-21 ENCOUNTER — Ambulatory Visit (HOSPITAL_COMMUNITY): Payer: 59 | Attending: Cardiovascular Disease

## 2021-08-21 ENCOUNTER — Other Ambulatory Visit: Payer: Self-pay

## 2021-08-21 DIAGNOSIS — I4891 Unspecified atrial fibrillation: Secondary | ICD-10-CM | POA: Diagnosis present

## 2021-08-21 LAB — ECHOCARDIOGRAM COMPLETE
MV M vel: 5.14 m/s
MV Peak grad: 105.6 mmHg
S' Lateral: 3.3 cm

## 2021-08-28 ENCOUNTER — Telehealth: Payer: Self-pay | Admitting: *Deleted

## 2021-08-28 NOTE — Telephone Encounter (Signed)
Received a message on the refill voicemail from pt.  He is wanting to know if he is to continue the Xarelto 20 mg?  Also, wants to know if this is going to be long term, if so, may be interested in getting it from the mail in pharmacy.  Pt is requesting a call back.

## 2021-08-28 NOTE — Telephone Encounter (Signed)
Attempted to contact patient. LVM to call back to discuss.

## 2021-08-29 NOTE — Telephone Encounter (Signed)
Per Dr Cherlyn Cushing last note, patient is to remain on Xarelto until echocardiogram is updated

## 2021-08-29 NOTE — Telephone Encounter (Signed)
   Pt is returning call, he said if we can't call tonight to call him between 8 - 9:30 am tomorrow

## 2021-08-30 ENCOUNTER — Other Ambulatory Visit: Payer: Self-pay

## 2021-08-30 MED ORDER — RIVAROXABAN 20 MG PO TABS: 20.0000 mg | ORAL_TABLET | Freq: Every day | ORAL | 1 refills | Status: DC

## 2021-08-30 NOTE — Telephone Encounter (Signed)
Called patient, advised of message below.  He was needing refill sent to pharmacy- I did send this. Patient does have follow up on 12/20 to discuss further

## 2021-09-04 ENCOUNTER — Ambulatory Visit: Payer: 59 | Admitting: Cardiology

## 2021-09-06 ENCOUNTER — Other Ambulatory Visit: Payer: Self-pay | Admitting: Emergency Medicine

## 2021-09-06 DIAGNOSIS — E063 Autoimmune thyroiditis: Secondary | ICD-10-CM

## 2021-09-06 DIAGNOSIS — E038 Other specified hypothyroidism: Secondary | ICD-10-CM

## 2021-09-07 ENCOUNTER — Ambulatory Visit (INDEPENDENT_AMBULATORY_CARE_PROVIDER_SITE_OTHER): Payer: 59 | Admitting: Emergency Medicine

## 2021-09-07 ENCOUNTER — Other Ambulatory Visit: Payer: Self-pay

## 2021-09-07 ENCOUNTER — Encounter: Payer: Self-pay | Admitting: Emergency Medicine

## 2021-09-07 VITALS — BP 124/86 | HR 73 | Temp 98.4°F | Ht 73.0 in | Wt 187.0 lb

## 2021-09-07 DIAGNOSIS — Z0001 Encounter for general adult medical examination with abnormal findings: Secondary | ICD-10-CM | POA: Diagnosis not present

## 2021-09-07 DIAGNOSIS — Z125 Encounter for screening for malignant neoplasm of prostate: Secondary | ICD-10-CM

## 2021-09-07 DIAGNOSIS — Z23 Encounter for immunization: Secondary | ICD-10-CM

## 2021-09-07 DIAGNOSIS — E039 Hypothyroidism, unspecified: Secondary | ICD-10-CM | POA: Diagnosis not present

## 2021-09-07 DIAGNOSIS — Z1211 Encounter for screening for malignant neoplasm of colon: Secondary | ICD-10-CM

## 2021-09-07 DIAGNOSIS — I4891 Unspecified atrial fibrillation: Secondary | ICD-10-CM | POA: Diagnosis not present

## 2021-09-07 NOTE — Patient Instructions (Signed)

## 2021-09-07 NOTE — Addendum Note (Signed)
Addended by: Durwin Nora on: 09/07/2021 10:23 AM   Modules accepted: Orders

## 2021-09-07 NOTE — Progress Notes (Signed)
Ethan Lee 70 y.o.   Chief Complaint  Patient presents with   Annual Exam    HISTORY OF PRESENT ILLNESS: This is a 70 y.o. male here for annual exam. Has the following medical problems: 1.  History of hypothyroidism on Synthroid 25 mcg daily. 2.  Recently diagnosed with chronic A. fib.  On Xarelto.  Recent echocardiogram results as follows: IMPRESSIONS     1. Left ventricular ejection fraction, by estimation, is 60 to 65%. The  left ventricle has normal function. The left ventricle has no regional  wall motion abnormalities. Left ventricular diastolic parameters are  indeterminate.   2. Right ventricular systolic function is normal. The right ventricular  size is normal.   3. Left atrial size was mildly dilated.   4. 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 The mitral  valve is abnormal. Mild to moderate mitral valve regurgitation. No  evidence of mitral stenosis.   5. The aortic valve is tricuspid. Aortic valve regurgitation is not  visualized. No aortic stenosis is present.   6. Aortic dilatation noted. There is mild dilatation of the ascending  aorta, measuring 39 mm.   7. The inferior vena cava is normal in size with greater than 50%  respiratory variability, suggesting right atrial pressure of 3 mmHg.   HPI   Prior to Admission medications   Medication Sig Start Date End Date Taking? Authorizing Provider  cetirizine (ZYRTEC) 10 MG tablet Take 10 mg by mouth every evening.    [provider]  Cetirizine HCl (ZYRTEC ALLERGY) 10 MG CAPS one    [provider]  cholecalciferol (VITAMIN D) 1000 UNITS tablet Take 1,000 Units by mouth daily.    [provider]  Cyanocobalamin (VITAMIN B 12) 100 MCG LOZG     [provider]  cyclobenzaprine (FLEXERIL) 10 MG tablet Take 1 tablet (10 mg total) by mouth 3 (three) times daily as needed for muscle spasms. 11/02/20   Horald Pollen, MD  EPINEPHrine 0.3 mg/0.3 mL IJ  SOAJ injection epinephrine 0.3 mg/0.3 mL injection, auto-injector  AS DIRECTED FOR SYSTEMIC REACTION INJECTION AS NEEDED 30 DAYS 04/20/21   [provider]  levothyroxine (SYNTHROID) 50 MCG tablet Take 1 tablet (50 mcg total) by mouth daily before breakfast. 11/02/20   Horald Pollen, MD  loratadine (CLARITIN) 10 MG tablet Take 10 mg by mouth every morning.    [provider]  rivaroxaban (XARELTO) 20 MG TABS tablet Take 1 tablet (20 mg total) by mouth daily with supper. 08/30/21   Minus Breeding, MD  vitamin B-12 (CYANOCOBALAMIN) 100 MCG tablet Take 100 mcg by mouth daily.    [provider]  vitamin C (ASCORBIC ACID) 500 MG tablet Take 500 mg by mouth daily. Two tabs    [provider]  vitamin C (ASCORBIC ACID) 500 MG tablet 1 tablet    [provider]  vitamin E 400 UNIT capsule Take 400 Units by mouth daily.    [provider]    Allergies  Allergen Reactions   Shellfish Allergy     Patient Active Problem List   Diagnosis Date Noted   Atrial fibrillation ([place]) 08/14/2021   Aneurysm of ascending aorta without rupture 08/02/2021   Palpitations 08/02/2021   Hypothyroidism 03/15/2016   Abnormal heart sounds 07/15/2012   Allergy history, peanuts 06/30/2012    Past Medical History:  Diagnosis Date   Allergy    Hypothyroid     Past Surgical History:  Procedure Laterality  Date   KNEE SURGERY     x 4   TONSILLECTOMY AND ADENOIDECTOMY     Vocal cord polyps      Social History   Socioeconomic History   Marital status: Married    Spouse name: Not on file   Number of children: 2   Years of education: Not on file   Highest education level: Not on file  Occupational History    Employer: [employer]  Tobacco Use   Smoking status: Never   Smokeless tobacco: Never  Substance and Sexual Activity   Alcohol use: Yes    Alcohol/week: 3.0 standard drinks    Types: 3 Glasses of wine per week   Drug use: No   Sexual  activity: Yes  Other Topics Concern   Not on file  Social History Narrative   Lives with wife and works at [employer]  granddaughters.       Social Determinants of Health   Financial Resource Strain: Not on file  Food Insecurity: Not on file  Transportation Needs: Not on file  Physical Activity: Not on file  Stress: Not on file  Social Connections: Not on file  Intimate Partner Violence: Not on file    Family History  Problem Relation Age of Onset   Cancer Mother        COLON CANCER   Leukemia Father    Other Father        blood disorder   Cancer Brother        testicular cancer   Cancer Brother        liver/kidney cancer     Review of Systems  Constitutional: Negative.  Negative for chills and fever.  HENT: Negative.  Negative for congestion and sore throat.   Eyes: Negative.   Respiratory: Negative.  Negative for cough and shortness of breath.   Cardiovascular: Negative.  Negative for chest pain and palpitations.  Gastrointestinal: Negative.  Negative for abdominal pain, diarrhea, nausea and vomiting.  Genitourinary: Negative.  Negative for dysuria and hematuria.  Musculoskeletal: Negative.  Negative for back pain, myalgias and neck pain.  Skin: Negative.  Negative for rash.  Neurological: Negative.  Negative for dizziness and headaches.  All other systems reviewed and are negative. Today's Vitals   09/07/21 0757  BP: 124/86  Pulse: 73  Temp: 98.4 F (36.9 C)  TempSrc: Oral  SpO2: 99%  Weight: 187 lb (84.8 kg)  Height: 6\' 1"  (1.854 m)   Body mass index is 24.67 kg/m.   Physical Exam Vitals reviewed.  Constitutional:      Appearance: Normal appearance.  HENT:     Head: Normocephalic.     Right Ear: Tympanic membrane, ear canal and external ear normal.     Left Ear: Tympanic membrane, ear canal and external ear normal.  Eyes:     Extraocular Movements: Extraocular movements intact.     Conjunctiva/sclera: Conjunctivae normal.     Pupils:  Pupils are equal, round, and reactive to light.  Cardiovascular:     Rate and Rhythm: Normal rate. Rhythm irregular.     Heart sounds: Normal heart sounds. No murmur heard. Pulmonary:     Effort: Pulmonary effort is normal.     Breath sounds: Normal breath sounds.  Abdominal:     General: There is no distension.     Palpations: Abdomen is soft. There is no mass.     Tenderness: There is no abdominal tenderness.  Musculoskeletal:  General: Normal range of motion.     Cervical back: Normal range of motion and neck supple. No tenderness.     Right lower leg: No edema.     Left lower leg: No edema.  Skin:    General: Skin is warm and dry.     Capillary Refill: Capillary refill takes less than 2 seconds.  Neurological:     General: No focal deficit present.     Mental Status: He is alert and oriented to person, place, and time.  Psychiatric:        Mood and Affect: Mood normal.        Behavior: Behavior normal.     ASSESSMENT & PLAN: Problem List Items Addressed This Visit       Cardiovascular and Mediastinum   Atrial fibrillation [facility])     Endocrine   Hypothyroidism   Relevant Orders   TSH   Other Visit Diagnoses     Encounter for general adult medical examination with abnormal findings    -  Primary   Prostate cancer screening       Relevant Orders   PSA   Colon cancer screening       Relevant Orders   Ambulatory referral to Gastroenterology   Need for shingles vaccine          Modifiable risk factors discussed with patient. Anticipatory guidance according to age provided. The following topics were also discussed: Social Determinants of Health Smoking.  Non-smoker Diet and nutrition Benefits of exercise Cancer screening and need for colon cancer screening with colonoscopy Vaccinations recommendations.  Needs shingles vaccine Cardiovascular risk assessment The 10-year ASCVD risk score (Arnett DK, et al., 2019) is: 16.5%   Values used to calculate the  score:     Age: 54 years     Sex: Male     Is Non-Hispanic African American: No     Diabetic: No     Tobacco smoker: No     Systolic Blood Pressure: 938 mmHg     Is BP treated: No     HDL Cholesterol: 41 mg/dL     Total Cholesterol: 155 mg/dL Chronic atrial fibrillation and need to follow-up with cardiologist Long-term anticoagulation with Xarelto.  Fall precautions. Mental health including depression and anxiety Fall and accident prevention  Patient Instructions  Health Maintenance, Male Adopting a healthy lifestyle and getting preventive care are important in promoting health and wellness. Ask your health care provider about: The right schedule for you to have regular tests and exams. Things you can do on your own to prevent diseases and keep yourself healthy. What should I know about diet, weight, and exercise? Eat a healthy diet  Eat a diet that includes plenty of vegetables, fruits, low-fat dairy products, and lean protein. Do not eat a lot of foods that are high in solid fats, added sugars, or sodium. Maintain a healthy weight Body mass index (BMI) is a measurement that can be used to identify possible weight problems. It estimates body fat based on height and weight. Your health care provider can help determine your BMI and help you achieve or maintain a healthy weight. Get regular exercise Get regular exercise. This is one of the most important things you can do for your health. Most adults should: Exercise for at least 150 minutes each week. The exercise should increase your heart rate and make you sweat (moderate-intensity exercise). Do strengthening exercises at least twice a week. This is in addition to the moderate-intensity exercise.  Spend less time sitting. Even light physical activity can be beneficial. Watch cholesterol and blood lipids Have your blood tested for lipids and cholesterol at 70 years of age, then have this test every 5 years. You may need to have your  cholesterol levels checked more often if: Your lipid or cholesterol levels are high. You are older than 70 years of age. You are at high risk for heart disease. What should I know about cancer screening? Many types of cancers can be detected early and may often be prevented. Depending on your health history and family history, you may need to have cancer screening at various ages. This may include screening for: Colorectal cancer. Prostate cancer. Skin cancer. Lung cancer. What should I know about heart disease, diabetes, and high blood pressure? Blood pressure and heart disease High blood pressure causes heart disease and increases the risk of stroke. This is more likely to develop in people who have high blood pressure readings or are overweight. Talk with your health care provider about your target blood pressure readings. Have your blood pressure checked: Every 3-5 years if you are 67-63 years of age. Every year if you are 57 years old or older. If you are between the ages of 49 and 72 and are a current or former smoker, ask your health care provider if you should have a one-time screening for abdominal aortic aneurysm (AAA). Diabetes Have regular diabetes screenings. This checks your fasting blood sugar level. Have the screening done: Once every three years after age 16 if you are at a normal weight and have a low risk for diabetes. More often and at a younger age if you are overweight or have a high risk for diabetes. What should I know about preventing infection? Hepatitis B If you have a higher risk for hepatitis B, you should be screened for this virus. Talk with your health care provider to find out if you are at risk for hepatitis B infection. Hepatitis C Blood testing is recommended for: Everyone born from [birth year] through 1965. Anyone with known risk factors for hepatitis C. Sexually transmitted infections (STIs) You should be screened each year for STIs, including gonorrhea  and chlamydia, if: You are sexually active and are younger than 70 years of age. You are older than 70 years of age and your health care provider tells you that you are at risk for this type of infection. Your sexual activity has changed since you were last screened, and you are at increased risk for chlamydia or gonorrhea. Ask your health care provider if you are at risk. Ask your health care provider about whether you are at high risk for HIV. Your health care provider may recommend a prescription medicine to help prevent HIV infection. If you choose to take medicine to prevent HIV, you should first get tested for HIV. You should then be tested every 3 months for as long as you are taking the medicine. Follow these instructions at home: Alcohol use Do not drink alcohol if your health care provider tells you not to drink. If you drink alcohol: Limit how much you have to 0-2 drinks a day. Know how much alcohol is in your drink. In the [place] one drink equals one 12 oz bottle of beer (355 mL), one 5 oz glass of wine (148 mL), or one 1 oz glass of hard liquor (44 mL). Lifestyle Do not use any products that contain nicotine or tobacco. These products include cigarettes, chewing tobacco, and vaping devices,  such as e-cigarettes. If you need help quitting, ask your health care provider. Do not use street drugs. Do not share needles. Ask your health care provider for help if you need support or information about quitting drugs. General instructions Schedule regular health, dental, and eye exams. Stay current with your vaccines. Tell your health care provider if: You often feel depressed. You have ever been abused or do not feel safe at home. Summary Adopting a healthy lifestyle and getting preventive care are important in promoting health and wellness. Follow your health care provider's instructions about healthy diet, exercising, and getting tested or screened for diseases. Follow your health  care provider's instructions on monitoring your cholesterol and blood pressure. This information is not intended to replace advice given to you by your health care provider. Make sure you discuss any questions you have with your health care provider. Document Revised: 02/06/2021 Document Reviewed: 02/06/2021 Elsevier Patient Education  2022 [place], [place] [facility]

## 2021-09-08 LAB — PSA: Prostate Specific Ag, Serum: 1 ng/mL (ref 0.0–4.0)

## 2021-09-08 LAB — TSH: TSH: 5.71 u[IU]/mL — ABNORMAL HIGH (ref 0.450–4.500)

## 2021-09-09 ENCOUNTER — Other Ambulatory Visit: Payer: Self-pay | Admitting: Emergency Medicine

## 2021-09-10 ENCOUNTER — Encounter: Payer: Self-pay | Admitting: Emergency Medicine

## 2021-09-17 NOTE — Progress Notes (Signed)
Cardiology Office Note   Date:  09/19/2021   ID:  Ethan Lee, DOB 13-Mar-1951, MRN [id number]  PCP:  Horald Pollen, MD  Cardiologist:   Minus Breeding, MD  Referring:  Horald Pollen, *   Chief Complaint  Patient presents with   Atrial Fibrillation       History of Present Illness: Ethan Lee is a 70 y.o. male who presents for evaluation of palpitations.  I saw him in 2013 for evaluation of possible mitral valve click and in 6010 for palpitations.  He was noted recently during an orthopedic appointment to have an irregular heartbeat.  At the last visit she was in atrial fib.   He has mild to moderate MR on echo with a normal EF on echo.  I did start Xarelto at the last visit.    He returns for follow-up.  He has not noticed any atrial fibrillation.  He has any palpitations.  He denies any chest pressure, neck or arm discomfort.  He has had no weight gain or edema.  He would not know if he was in fibrillation.  He does not feel this.  He does not have any issues taking the anticoagulant.   Past Medical History:  Diagnosis Date   Allergy    Hypothyroid     Past Surgical History:  Procedure Laterality Date   KNEE SURGERY     x 4   TONSILLECTOMY AND ADENOIDECTOMY     Vocal cord polyps       Current Outpatient Medications  Medication Sig Dispense Refill   cetirizine (ZYRTEC) 10 MG tablet Take 10 mg by mouth every evening.     cholecalciferol (VITAMIN D) 1000 UNITS tablet Take 1,000 Units by mouth daily.     Cyanocobalamin (VITAMIN B 12) 100 MCG LOZG      EPINEPHrine 0.3 mg/0.3 mL IJ SOAJ injection epinephrine 0.3 mg/0.3 mL injection, auto-injector  AS DIRECTED FOR SYSTEMIC REACTION INJECTION AS NEEDED 30 DAYS     levothyroxine (SYNTHROID) 50 MCG tablet TAKE 1 TABLET BY MOUTH  DAILY BEFORE BREAKFAST 90 tablet 3   loratadine (CLARITIN) 10 MG tablet Take 10 mg by mouth every morning.     rivaroxaban (XARELTO) 20 MG TABS tablet Take 1 tablet (20  mg total) by mouth daily with supper. 30 tablet 1   vitamin B-12 (CYANOCOBALAMIN) 100 MCG tablet Take 100 mcg by mouth daily.     vitamin C (ASCORBIC ACID) 500 MG tablet Take 500 mg by mouth daily. Two tabs     vitamin E 400 UNIT capsule Take 400 Units by mouth daily.     No current facility-administered medications for this visit.    Allergies:   Shellfish allergy    ROS:  Please see the history of present illness.   Otherwise, review of systems are positive for none.   All other systems are reviewed and negative.    PHYSICAL EXAM: VS:  BP 135/90    Pulse (!) 57    Ht 6' (1.829 m)    Wt 190 lb 6.4 oz (86.4 kg)    SpO2 100%    BMI 25.82 kg/m  , BMI Body mass index is 25.82 kg/m. GENERAL:  Well appearing NECK:  No jugular venous distention, waveform within normal limits, carotid upstroke brisk and symmetric, no bruits, no thyromegaly LUNGS:  Clear to auscultation bilaterally CHEST:  Unremarkable HEART:  PMI not displaced or sustained,S1 and S2 within normal limits, no S3, no clicks, no rubs,  no murmurs, irregular  ABD:  Flat, positive bowel sounds normal in frequency in pitch, no bruits, no rebound, no guarding, no midline pulsatile mass, no hepatomegaly, no splenomegaly EXT:  2 plus pulses throughout, no edema, no cyanosis no clubbing   EKG:  EKG is  ordered today. The ekg ordered  demonstrates atrial fibrillation, rate 67, left axis deviation, poor anterior R wave progression, borderline first-degree AV block, no acute ST-T wave changes.  There is no change compared to previous.    Recent Labs: 06/23/2021: ALT 11; BUN 21; Creatinine, Ser 1.15; Hemoglobin 15.5; Platelets 168; Potassium 4.6; Sodium 143 09/07/2021: TSH 5.710    Lipid Panel    Component Value Date/Time   CHOL 155 06/23/2021 0716   TRIG 66 06/23/2021 0716   HDL 41 06/23/2021 0716   CHOLHDL 3.8 06/23/2021 0716   CHOLHDL 4.0 07/26/2015 0852   VLDL 20 07/26/2015 0852   LDLCALC 101 (H) 06/23/2021 0716      Wt  Readings from Last 3 Encounters:  09/19/21 190 lb 6.4 oz (86.4 kg)  09/07/21 187 lb (84.8 kg)  08/14/21 191 lb (86.6 kg)      Other studies Reviewed: Additional studies/ records that were reviewed today include: Labs Review of the above records demonstrates:  Please see elsewhere in the note.     ASSESSMENT AND PLAN:   ATRIAL FIB: The patient has new onset atrial fibrillation although he has not had a recent EKG.  Ethan Lee has a CHA2DS2 - VASc score of 1.   He tolerates anticoagulation.   I am going to send a 3-day Zio patch and if he is atrial fibrillation is persistent I will schedule him for cardioversion.   THYROID: He has had some fluctuations in his TSH but currently this has normalized and his dose has been adjusted.      Current medicines are reviewed at length with the patient today.  The patient does not have concerns regarding medicines.  The following changes have been made: As above  Labs/ tests ordered today include:   Orders Placed This Encounter  Procedures   LONG TERM MONITOR (3-14 DAYS)   EKG 12-Lead      Disposition:   FU with me after the DCCV.  This will need to be scheduled after the cardioversion.   Signed, Minus Breeding, MD  09/19/2021 5:41 PM    [place]

## 2021-09-19 ENCOUNTER — Encounter: Payer: Self-pay | Admitting: Cardiology

## 2021-09-19 ENCOUNTER — Other Ambulatory Visit: Payer: Self-pay

## 2021-09-19 ENCOUNTER — Ambulatory Visit: Payer: 59 | Admitting: Cardiology

## 2021-09-19 VITALS — BP 135/90 | HR 57 | Ht 72.0 in | Wt 190.4 lb

## 2021-09-19 DIAGNOSIS — I4891 Unspecified atrial fibrillation: Secondary | ICD-10-CM

## 2021-09-19 NOTE — Patient Instructions (Signed)
Medication Instructions:  Your Physician recommend you continue on your current medication as directed.    *If you need a refill on your cardiac medications before your next appointment, please call your pharmacy*   Testing/Procedures: [place] Monitor Instructions  Your physician has requested you wear a ZIO patch monitor for 3 days.  This is a single patch monitor. [employer] supplies one patch monitor per enrollment. Additional stickers are not available. Please do not apply patch if you will be having a Nuclear Stress Test,  Echocardiogram, Cardiac CT, MRI, or Chest Xray during the period you would be wearing the  monitor. The patch cannot be worn during these tests. You cannot remove and re-apply the  ZIO XT patch monitor.  Your ZIO patch monitor will be mailed 3 day USPS to your address on file. It may take 3-5 days  to receive your monitor after you have been enrolled.  Once you have received your monitor, please review the enclosed instructions. Your monitor  has already been registered assigning a specific monitor serial # to you.  Billing and Patient Assistance Program Information  We have supplied [employer] with any of your insurance information on file for billing purposes. [employer] offers a sliding scale Patient Assistance Program for patients that do not have  insurance, or whose insurance does not completely cover the cost of the ZIO monitor.  You must apply for the Patient Assistance Program to qualify for this discounted rate.  To apply, please call [employer] at [phone], select option 4, select option 2, ask to apply for  Patient Assistance Program. Theodore Demark will ask your household income, and how many people  are in your household. They will quote your out-of-pocket cost based on that information.  [employer] will also be able to set up a 52-month, interest-free payment plan if needed.  Applying the monitor   Shave hair from upper left chest.  Hold abrader disc  by orange tab. Rub abrader in 40 strokes over the upper left chest as  indicated in your monitor instructions.  Clean area with 4 enclosed alcohol pads. Let dry.  Apply patch as indicated in monitor instructions. Patch will be placed under collarbone on left  side of chest with arrow pointing upward.  Rub patch adhesive wings for 2 minutes. Remove white label marked "1". Remove the white  label marked "2". Rub patch adhesive wings for 2 additional minutes.  While looking in a mirror, press and release button in center of patch. A small green light will  flash 3-4 times. This will be your only indicator that the monitor has been turned on.  Do not shower for the first 24 hours. You may shower after the first 24 hours.  Press the button if you feel a symptom. You will hear a small click. Record Date, Time and  Symptom in the Patient Logbook.  When you are ready to remove the patch, follow instructions on the last 2 pages of Patient  Logbook. Stick patch monitor onto the last page of Patient Logbook.  Place Patient Logbook in the blue and white box. Use locking tab on box and tape box closed  securely. The blue and white box has prepaid postage on it. Please place it in the mailbox as  soon as possible. Your physician should have your test results approximately 7 days after the  monitor has been mailed back to [facility].  Call [place] at [phone] if you have questions regarding  your ZIO  XT patch monitor. Call them immediately if you see an orange light blinking on your  monitor.  If your monitor falls off in less than 4 days, contact our Monitor department at [phone].  If your monitor becomes loose or falls off after 4 days call [employer] at [phone] for  suggestions on securing your monitor    Follow-Up: At [facility], you and your health needs are our priority.  As part of our continuing mission to provide you with exceptional heart care, we  have created designated Provider Care Teams.  These Care Teams include your primary Cardiologist (physician) and Advanced Practice Providers (APPs -  Physician Assistants and Nurse Practitioners) who all work together to provide you with the care you need, when you need it.  We recommend signing up for the patient portal called "MyChart".  Sign up information is provided on this After Visit Summary.  MyChart is used to connect with patients for Virtual Visits (Telemedicine).  Patients are able to view lab/test results, encounter notes, upcoming appointments, etc.  Non-urgent messages can be sent to your provider as well.   To learn more about what you can do with MyChart, go to [email or web address].    Your next appointment:   6 month(s)  The format for your next appointment:   In Person  Provider:   Dr. Vita Barley, MD

## 2021-09-20 ENCOUNTER — Encounter: Payer: Self-pay | Admitting: Cardiology

## 2021-09-20 ENCOUNTER — Ambulatory Visit (INDEPENDENT_AMBULATORY_CARE_PROVIDER_SITE_OTHER): Payer: 59

## 2021-09-20 ENCOUNTER — Other Ambulatory Visit: Payer: Self-pay

## 2021-09-20 DIAGNOSIS — I4891 Unspecified atrial fibrillation: Secondary | ICD-10-CM

## 2021-09-20 MED ORDER — RIVAROXABAN 20 MG PO TABS
20.0000 mg | ORAL_TABLET | Freq: Every day | ORAL | 1 refills | Status: DC
Start: 2021-09-20 — End: 2022-02-20

## 2021-09-20 NOTE — Progress Notes (Unsigned)
Enrolled patient for a 3 day Zio XT monitor to be mailed to patients home  

## 2021-09-20 NOTE — Telephone Encounter (Signed)
Prescription refill request for Xarelto received.  Indication:Afib Last office visit:12/22 Weight:86.4 kg Age:69 Scr:1.1 CrCl:76.36 ml/min  Prescription refilled

## 2021-09-23 DIAGNOSIS — I4891 Unspecified atrial fibrillation: Secondary | ICD-10-CM | POA: Diagnosis not present

## 2021-10-09 ENCOUNTER — Telehealth: Payer: Self-pay | Admitting: *Deleted

## 2021-10-09 ENCOUNTER — Encounter: Payer: Self-pay | Admitting: *Deleted

## 2021-10-09 NOTE — Telephone Encounter (Signed)
-----   Message from Minus Breeding, MD sent at 10/07/2021 12:05 PM EST ----- Atrial fib is persistent.  He needs to have a DCCV.  Please make sure he has been taking his DOAC everyday for 3 weeks and we can schedule him for a DCCV.  Call [name] with the results and send results to Horald Pollen, MD

## 2021-10-09 NOTE — Telephone Encounter (Signed)
Left message for pt to call.

## 2021-10-09 NOTE — Telephone Encounter (Signed)
Spoke with pt, he has not missed any doses of his xarelto. He would like to have the DCCV next week. 1st available is 10/24/21 @ 8 am. Instructions discussed and sent to patient via my chart.

## 2021-10-13 ENCOUNTER — Encounter (HOSPITAL_COMMUNITY): Payer: Self-pay | Admitting: Cardiology

## 2021-10-13 NOTE — Progress Notes (Signed)
Attempted to obtain medical history via telephone, unable to reach at this time. I left a voicemail to return pre surgical testing department's phone call.  

## 2021-10-23 ENCOUNTER — Other Ambulatory Visit: Payer: Self-pay

## 2021-10-23 DIAGNOSIS — I4891 Unspecified atrial fibrillation: Secondary | ICD-10-CM

## 2021-10-24 ENCOUNTER — Ambulatory Visit (HOSPITAL_COMMUNITY)
Admission: RE | Admit: 2021-10-24 | Discharge: 2021-10-24 | Disposition: A | Discharge status: Home / Self Care | Payer: 59 | Attending: Cardiology | Admitting: Cardiology

## 2021-10-24 ENCOUNTER — Ambulatory Visit (HOSPITAL_COMMUNITY): Payer: 59 | Admitting: Certified Registered Nurse Anesthetist

## 2021-10-24 ENCOUNTER — Encounter (HOSPITAL_COMMUNITY)
Admission: RE | Disposition: A | Discharge status: Home / Self Care | Payer: Self-pay | Source: Home / Self Care | Attending: Cardiology

## 2021-10-24 ENCOUNTER — Encounter (HOSPITAL_COMMUNITY): Payer: Self-pay | Admitting: Cardiology

## 2021-10-24 DIAGNOSIS — I4891 Unspecified atrial fibrillation: Secondary | ICD-10-CM | POA: Insufficient documentation

## 2021-10-24 DIAGNOSIS — E039 Hypothyroidism, unspecified: Secondary | ICD-10-CM | POA: Insufficient documentation

## 2021-10-24 DIAGNOSIS — I340 Nonrheumatic mitral (valve) insufficiency: Secondary | ICD-10-CM | POA: Diagnosis not present

## 2021-10-24 HISTORY — PX: CARDIOVERSION: SHX1299

## 2021-10-24 LAB — POCT I-STAT, CHEM 8
BUN: 20 mg/dL (ref 8–23)
Calcium, Ion: 1.17 mmol/L (ref 1.15–1.40)
Chloride: 105 mmol/L (ref 98–111)
Creatinine, Ser: 1.1 mg/dL (ref 0.61–1.24)
Glucose, Bld: 93 mg/dL (ref 70–99)
HCT: 46 % (ref 39.0–52.0)
Hemoglobin: 15.6 g/dL (ref 13.0–17.0)
Potassium: 4.1 mmol/L (ref 3.5–5.1)
Sodium: 141 mmol/L (ref 135–145)
TCO2: 28 mmol/L (ref 22–32)

## 2021-10-24 SURGERY — CARDIOVERSION
Anesthesia: General

## 2021-10-24 MED ORDER — SODIUM CHLORIDE 0.9 % IV SOLN: INTRAVENOUS | Status: DC | PRN

## 2021-10-24 MED ORDER — LIDOCAINE 2% (20 MG/ML) 5 ML SYRINGE
INTRAMUSCULAR | Status: DC | PRN
  Administered 2021-10-24: 60 mg via INTRAVENOUS

## 2021-10-24 MED ORDER — PROPOFOL 10 MG/ML IV BOLUS
INTRAVENOUS | Status: DC | PRN
  Administered 2021-10-24: 80 mg via INTRAVENOUS
  Administered 2021-10-24: 20 mg via INTRAVENOUS

## 2021-10-24 MED ORDER — SODIUM CHLORIDE 0.9 % IV SOLN: INTRAVENOUS | Status: DC

## 2021-10-24 NOTE — Discharge Instructions (Signed)

## 2021-10-24 NOTE — Anesthesia Postprocedure Evaluation (Signed)
Anesthesia Post Note  Patient: Ethan Lee  Procedure(s) Performed: CARDIOVERSION     Patient location during evaluation: Endoscopy Anesthesia Type: General Level of consciousness: awake and alert Pain management: pain level controlled Vital Signs Assessment: post-procedure vital signs reviewed and stable Respiratory status: spontaneous breathing, nonlabored ventilation, respiratory function stable and patient connected to nasal cannula oxygen Cardiovascular status: blood pressure returned to baseline and stable Postop Assessment: no apparent nausea or vomiting Anesthetic complications: no   No notable events documented.  Last Vitals:  Vitals:   10/24/21 0831 10/24/21 0841  BP: 124/85 (!) 142/80  Pulse: 63 69  Resp: 18 14  Temp:    SpO2: 100% 100%    Last Pain:  Vitals:   10/24/21 0831  TempSrc:   PainSc: 0-No pain                 Belenda Cruise P Chessie Neuharth

## 2021-10-24 NOTE — Transfer of Care (Signed)
Immediate Anesthesia Transfer of Care Note  Patient: Ethan Lee  Procedure(s) Performed: CARDIOVERSION  Patient Location: Endoscopy Unit  Anesthesia Type:General  Level of Consciousness: awake, alert , patient cooperative and responds to stimulation  Airway & Oxygen Therapy: Patient Spontanous Breathing  Post-op Assessment: Report given to RN and Post -op Vital signs reviewed and stable  Post vital signs: Reviewed and stable  Last Vitals:  Vitals Value Taken Time  BP    Temp    Pulse    Resp    SpO2      Last Pain:  Vitals:   10/24/21 0720  TempSrc: Temporal  PainSc: 0-No pain         Complications: No notable events documented.

## 2021-10-24 NOTE — H&P (Signed)
Cardiology Admission History and Physical:   Patient ID: Ethan Lee MRN: [id number]; DOB: Oct 08, 1950   Admission date: 10/24/2021  PCP:  Ethan Pollen, MD   [facility] Cardiologist:  Ethan Breeding, MD        Chief Complaint:  atrial fibrillatiion  Patient Profile:   Ethan Lee is a 71 y.o. male with afib who is being seen 10/24/2021 for the DCCV.  History of Present Illness:   [name] with history of atrial fibrillation being here  DCCV.   Past Medical History:  Diagnosis Date   Allergy    Hypothyroid     Past Surgical History:  Procedure Laterality Date   KNEE SURGERY     x 4   TONSILLECTOMY AND ADENOIDECTOMY     Vocal cord polyps       Medications Prior to Admission: Prior to Admission medications   Medication Sig Start Date End Date Taking? Authorizing Provider  cetirizine (ZYRTEC) 10 MG tablet Take 10 mg by mouth at bedtime.   Yes [provider]  cholecalciferol (VITAMIN D) 1000 UNITS tablet Take 1,000 Units by mouth daily.   Yes [provider]  EPINEPHrine 0.3 mg/0.3 mL IJ SOAJ injection Inject 0.3 mg into the muscle as needed for anaphylaxis. 04/20/21  Yes [provider]  levothyroxine (SYNTHROID) 50 MCG tablet TAKE 1 TABLET BY MOUTH  DAILY BEFORE BREAKFAST 09/07/21  Yes Sagardia, Ines Bloomer, MD  rivaroxaban (XARELTO) 20 MG TABS tablet Take 1 tablet (20 mg total) by mouth daily with supper. 09/20/21  Yes Ethan Breeding, MD  vitamin B-12 (CYANOCOBALAMIN) 100 MCG tablet Take 100 mcg by mouth daily.   Yes [provider]  vitamin C (ASCORBIC ACID) 500 MG tablet Take 500 mg by mouth daily. Two tabs   Yes [provider]  vitamin E 400 UNIT capsule Take 400 Units by mouth daily.   Yes [provider]     Allergies:    Allergies  Allergen Reactions   Shellfish Allergy Hives    Social History:   Social History   Socioeconomic History   Marital status: Married     Spouse name: Not on file   Number of children: 2   Years of education: Not on file   Highest education level: Not on file  Occupational History    Employer: [employer]  Tobacco Use   Smoking status: Never   Smokeless tobacco: Never  Substance and Sexual Activity   Alcohol use: Yes    Alcohol/week: 3.0 standard drinks    Types: 3 Glasses of wine per week   Drug use: No   Sexual activity: Yes  Other Topics Concern   Not on file  Social History Narrative   Lives with wife and works at [employer]  granddaughters.       Social Determinants of Health   Financial Resource Strain: Not on file  Food Insecurity: Not on file  Transportation Needs: Not on file  Physical Activity: Not on file  Stress: Not on file  Social Connections: Not on file  Intimate Partner Violence: Not on file    Family History:   The patient's family history includes Cancer in his brother, brother, and mother; Leukemia in his father; Other in his father.    ROS:  Please see the history of present illness.  Reports fatigue but all other ROS reviewed and negative.     Physical Exam/Data:   Vitals:   10/24/21 0720  BP: 139/86  Pulse: 77  Resp: (!) 21  Temp: (!) 97.4 F (36.3 C)  TempSrc: Temporal  SpO2: 100%  Weight: 87.1 kg  Height: 6\' 1"  (1.854 m)   No intake or output data in the 24 hours ending 10/24/21 0758 Last 3 Weights 10/24/2021 09/19/2021 09/07/2021  Weight (lbs) 192 lb 190 lb 6.4 oz 187 lb  Weight (kg) 87.091 kg 86.365 kg 84.823 kg     Body mass index is 25.33 kg/m.  General:  Well nourished, well developed, in no acute distress HEENT: normal Neck: no JVD Vascular: No carotid bruits; Distal pulses 2+ bilaterally   Cardiac:  normal S1, S2; RRR; no murmur  Lungs:  clear to auscultation bilaterally, no wheezing, rhonchi or rales  Abd: soft, nontender, no hepatomegaly  Ext: no edema Musculoskeletal:  No deformities, BUE and BLE strength normal and equal Skin: warm and dry   Neuro:  CNs 2-12 intact, no focal abnormalities noted Psych:  Normal affect    EKG:  The ECG that was done  was personally reviewed and demonstrates shows atrial fibrillaiton  Relevant CV Studies:  TTE 08/21/2001 IMPRESSIONS     1. Left ventricular ejection fraction, by estimation, is 60 to 65%. The  left ventricle has normal function. The left ventricle has no regional  wall motion abnormalities. Left ventricular diastolic parameters are  indeterminate.   2. Right ventricular systolic function is normal. The right ventricular  size is normal.   3. Left atrial size was mildly dilated.   4. 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 The mitral  valve is abnormal. Mild to moderate mitral valve regurgitation. No  evidence of mitral stenosis.   5. The aortic valve is tricuspid. Aortic valve regurgitation is not  visualized. No aortic stenosis is present.   6. Aortic dilatation noted. There is mild dilatation of the ascending  aorta, measuring 39 mm.   7. The inferior vena cava is normal in size with greater than 50%  respiratory variability, suggesting right atrial pressure of 3 mmHg.   FINDINGS   Left Ventricle: Left ventricular ejection fraction, by estimation, is 60  to 65%. The left ventricle has normal function. The left ventricle has no  regional wall motion abnormalities. The left ventricular internal cavity  size was normal in size. There is   no left ventricular hypertrophy. Left ventricular diastolic parameters  are indeterminate.   Right Ventricle: The right ventricular size is normal. No increase in  right ventricular wall thickness. Right ventricular systolic function is  normal.   Left Atrium: Left atrial size was mildly dilated.   Right Atrium: Right atrial size was normal in size.   Pericardium: There is no evidence of pericardial effusion.   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  The mitral valve is  abnormal. There is mild thickening of the mitral valve  leaflet(s). Mild to moderate mitral valve regurgitation. No evidence of  mitral valve stenosis.   Tricuspid Valve: The tricuspid valve is normal in structure. Tricuspid  valve regurgitation is mild . No evidence of tricuspid stenosis.   Aortic Valve: The aortic valve is tricuspid. Aortic valve regurgitation is  not visualized. No aortic stenosis is present.   Pulmonic Valve: The pulmonic valve was normal in structure. Pulmonic valve  regurgitation is not visualized. No evidence of pulmonic stenosis.   Aorta: The aortic root is normal in size and structure and aortic  dilatation noted. There is mild dilatation of the ascending aorta,  measuring 39 mm.   Venous: The inferior vena cava is normal in size with greater than 50%  respiratory variability, suggesting right atrial pressure of 3 mmHg.   IAS/Shunts: No atrial level shunt detected by color flow Doppler.      Laboratory Data:  High Sensitivity Troponin:  No results for input(s): TROPONINIHS in the last 720 hours.    Chemistry Recent Labs  Lab 10/24/21 0729  NA 141  K 4.1  CL 105  GLUCOSE 93  BUN 20  CREATININE 1.10    No results for input(s): PROT, ALBUMIN, AST, ALT, ALKPHOS, BILITOT in the last 168 hours. Lipids No results for input(s): CHOL, TRIG, HDL, LABVLDL, LDLCALC, CHOLHDL in the last 168 hours. Hematology Recent Labs  Lab 10/24/21 0729  HGB 15.6  HCT 46.0   Thyroid No results for input(s): TSH, FREET4 in the last 168 hours. BNPNo results for input(s): BNP, PROBNP in the last 168 hours.  DDimer No results for input(s): DDIMER in the last 168 hours.   Radiology/Studies:  No results found.   Assessment and Plan:    Afib - Here for DCCV.  Shared Decision Making/Informed Consent The risks (stroke, cardiac arrhythmias rarely resulting in the need for a temporary or permanent pacemaker, skin irritation or burns and complications associated with  conscious sedation including aspiration, arrhythmia, respiratory failure and death), benefits (restoration of normal sinus rhythm) and alternatives of a direct current cardioversion were explained in detail to Mr. Mcglone and he agrees to proceed.      Risk Assessment/Risk Scores:         CHA2DS2-VASc Score =     This indicates a  % annual risk of stroke. The patient's score is based upon:      Severity of Illness: The appropriate patient status for this patient is OBSERVATION. Observation status is judged to be reasonable and necessary in order to provide the required intensity of service to ensure the patient's safety. The patient's presenting symptoms, physical exam findings, and initial radiographic and laboratory data in the context of their medical condition is felt to place them at decreased risk for further clinical deterioration. Furthermore, it is anticipated that the patient will be medically stable for discharge from the hospital within 2 midnights of admission.    For questions or updates, please contact [place] Please consult [email or web address] for contact info under     Signed, Berniece Salines, DO  10/24/2021 7:58 AM

## 2021-10-24 NOTE — Anesthesia Preprocedure Evaluation (Addendum)
Anesthesia Evaluation  Patient identified by MRN, date of birth, ID band Patient awake    Reviewed: Allergy & Precautions, NPO status , Patient's Chart, lab work & pertinent test results  Airway Mallampati: II  TM Distance: >3 FB Neck ROM: Full    Dental  (+) Partial Upper   Pulmonary neg pulmonary ROS,    Pulmonary exam normal        Cardiovascular + dysrhythmias Atrial Fibrillation  Rhythm:Irregular Rate:Normal     Neuro/Psych negative neurological ROS  negative psych ROS   GI/Hepatic negative GI ROS, Neg liver ROS,   Endo/Other  Hypothyroidism   Renal/GU negative Renal ROS  negative genitourinary   Musculoskeletal negative musculoskeletal ROS (+)   Abdominal Normal abdominal exam  (+)   Peds  Hematology negative hematology ROS (+)   Anesthesia Other Findings   Reproductive/Obstetrics                            Anesthesia Physical Anesthesia Plan  ASA: 3  Anesthesia Plan: General   Post-op Pain Management:    Induction: Intravenous  PONV Risk Score and Plan: 2 and Propofol infusion and Treatment may vary due to age or medical condition  Airway Management Planned: Mask  Additional Equipment: None  Intra-op Plan:   Post-operative Plan:   Informed Consent:   Plan Discussed with:   Anesthesia Plan Comments: (ECHO 11/22: 1. Left ventricular ejection fraction, by estimation, is 60 to 65%. The  left ventricle has normal function. The left ventricle has no regional  wall motion abnormalities. Left ventricular diastolic parameters are  indeterminate.  2. Right ventricular systolic function is normal. The right ventricular  size is normal.  3. Left atrial size was mildly dilated.  4. 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 The mitral  valve is abnormal. Mild to moderate mitral valve regurgitation. No  evidence of mitral stenosis.  5. The aortic  valve is tricuspid. Aortic valve regurgitation is not  visualized. No aortic stenosis is present.  6. Aortic dilatation noted. There is mild dilatation of the ascending  aorta, measuring 39 mm.  7. The inferior vena cava is normal in size with greater than 50%  respiratory variability, suggesting right atrial pressure of 3 mmHg. )        Anesthesia Quick Evaluation

## 2021-10-24 NOTE — CV Procedure (Signed)
° °  Electrical Cardioversion Procedure Note Murle Otting [id number] 1950/12/19  Procedure: Electrical Cardioversion Indications:  Atrial Fibrillation  Time Out: Verified patient identification, verified procedure,medications/allergies/relevent history reviewed, required imaging and test results available.  Performed  Procedure Details  The patient signed informed consent.   The patient was NPO past midnight. Has had therapeutic anticoagulation with Xeralto greater than 3 weeks. The patient denies any interruption of anticoagulation.  Anesthesia was administered by the anesthesiology team.  Adequate airway was maintained throughout and vital followed per protocol.  He was cardioverted x 3 with 200J of biphasic synchronized energy.  He converted to NSR.  There were no apparent complications.  The patient tolerated the procedure well and had normal neuro status and respiratory status post procedure with vitals stable as recorded elsewhere.     IMPRESSION:  Successful cardioversion of atrial fibrillation to sinus bradycardia.   Follow up:  We will arrange follow up with Dr. Warren Lacy.  He will continue on current medical therapy.  The patient advised to continue anticoagulation.  Drucella Karbowski 10/24/2021, 8:13 AM

## 2021-10-25 ENCOUNTER — Encounter (HOSPITAL_COMMUNITY): Payer: Self-pay | Admitting: Cardiology

## 2021-11-29 ENCOUNTER — Telehealth: Payer: Self-pay

## 2021-11-29 ENCOUNTER — Ambulatory Visit (INDEPENDENT_AMBULATORY_CARE_PROVIDER_SITE_OTHER): Payer: 59 | Admitting: Gastroenterology

## 2021-11-29 ENCOUNTER — Encounter: Payer: Self-pay | Admitting: Gastroenterology

## 2021-11-29 VITALS — BP 122/82 | HR 105 | Ht 73.0 in | Wt 194.6 lb

## 2021-11-29 DIAGNOSIS — Z1211 Encounter for screening for malignant neoplasm of colon: Secondary | ICD-10-CM

## 2021-11-29 DIAGNOSIS — I4819 Other persistent atrial fibrillation: Secondary | ICD-10-CM | POA: Diagnosis not present

## 2021-11-29 MED ORDER — SUTAB 1479-225-188 MG PO TABS: 1.0000 | ORAL_TABLET | Freq: Once | ORAL | 0 refills | Status: AC

## 2021-11-29 NOTE — Progress Notes (Signed)
? ?HPI : Ethan Lee is a very pleasant 71 year old male with history of atrial fibrillation on Xarelto who is referred to [place] by Dr. Agustina Caroli for screening colonoscopy.  The patient's last colonoscopy was performed by Dr. Wyline Mood in April 2013 and was unremarkable.  He is recommended repeat in 10 years.  Patient denies any chronic GI symptoms to include abdominal pain, diarrhea, constipation or blood in his stool.  He has no family history of colon cancer. ?He does have atrial fibrillation and recently underwent elective cardioversion converting him into sinus rhythm.  He denies any symptoms from atrial fibrillation to include lightheadedness/dizziness, palpitations or chest pain/pressure.  He has been on Xarelto since around October of last year. ?He has a follow-up with his cardiologist in April. ? ? ? ? ?Past Medical History:  ?Diagnosis Date  ? Allergy   ? Hypothyroid   ? ? ? ?Past Surgical History:  ?Procedure Laterality Date  ? CARDIOVERSION N/A 10/24/2021  ? Procedure: CARDIOVERSION;  Surgeon: Berniece Salines, DO;  Location: [facility];  Service: Cardiovascular;  Laterality: N/A;  ? KNEE SURGERY    ? x 4  ? TONSILLECTOMY AND ADENOIDECTOMY    ? Vocal cord polyps    ? ?Family History  ?Problem Relation Age of Onset  ? Cancer Mother   ?     COLON CANCER  ? Leukemia Father   ? Other Father   ?     blood disorder  ? Cancer Brother   ?     testicular cancer  ? Cancer Brother   ?     liver/kidney cancer  ? ?Social History  ? ?Tobacco Use  ? Smoking status: Never  ? Smokeless tobacco: Never  ?Substance Use Topics  ? Alcohol use: Yes  ?  Alcohol/week: 3.0 standard drinks  ?  Types: 3 Glasses of wine per week  ? Drug use: No  ? ?Current Outpatient Medications  ?Medication Sig Dispense Refill  ? cetirizine (ZYRTEC) 10 MG tablet Take 10 mg by mouth at bedtime.    ? cholecalciferol (VITAMIN D) 1000 UNITS tablet Take 1,000 Units by mouth daily.    ? EPINEPHrine 0.3 mg/0.3 mL IJ SOAJ injection Inject 0.3 mg  into the muscle as needed for anaphylaxis.    ? levothyroxine (SYNTHROID) 50 MCG tablet TAKE 1 TABLET BY MOUTH  DAILY BEFORE BREAKFAST 90 tablet 3  ? rivaroxaban (XARELTO) 20 MG TABS tablet Take 1 tablet (20 mg total) by mouth daily with supper. 90 tablet 1  ? vitamin B-12 (CYANOCOBALAMIN) 100 MCG tablet Take 100 mcg by mouth daily.    ? vitamin C (ASCORBIC ACID) 500 MG tablet Take 500 mg by mouth daily. Two tabs    ? vitamin E 400 UNIT capsule Take 400 Units by mouth daily.    ? ?No current facility-administered medications for this visit.  ? ?Allergies  ?Allergen Reactions  ? Shellfish Allergy Hives  ? ? ? ?Review of Systems: ?All systems reviewed and negative except where noted in HPI.  ? ? ?No results found. ? ?Physical Exam: ?BP 122/82   Pulse (!) 105   Ht 6\' 1"  (1.854 m)   Wt 194 lb 9.6 oz (88.3 kg)   SpO2 97%   BMI 25.67 kg/m?  ?Constitutional: Pleasant,well-developed, Caucasian male in no acute distress. ?HEENT: Normocephalic and atraumatic. Conjunctivae are normal. No scleral icterus. ?Cardiovascular: Normal rate, regular rhythm.  ?Pulmonary/chest: Effort normal and breath sounds normal. No wheezing, rales or rhonchi. ?Abdominal: Soft, nondistended, nontender.  Bowel sounds active throughout. There are no masses palpable. No hepatomegaly. ?Extremities: no edema ?Lymphadenopathy: No cervical adenopathy noted. ?Neurological: Alert and oriented to person place and time. ?Skin: Skin is warm and dry. No rashes noted. ?Psychiatric: Normal mood and affect. Behavior is normal. ? ?CBC ?   ?Component Value Date/Time  ? WBC 4.6 06/23/2021 0715  ? WBC 3.4 (A) 01/26/2020 0902  ? RBC 5.36 06/23/2021 0715  ? RBC 4.48 (A) 01/26/2020 0902  ? HGB 15.6 10/24/2021 0729  ? HGB 15.5 06/23/2021 0715  ? HCT 46.0 10/24/2021 0729  ? HCT 45.0 06/23/2021 0715  ? PLT 168 06/23/2021 0715  ? MCV 84 06/23/2021 0715  ? MCH 28.9 06/23/2021 0715  ? MCH 28.7 01/26/2020 0902  ? MCHC 34.4 06/23/2021 0715  ? MCHC 32.4 01/26/2020 0902  ? RDW  12.1 06/23/2021 0715  ? LYMPHSABS 1.6 06/23/2021 0715  ? EOSABS 0.2 06/23/2021 0715  ? BASOSABS 0.0 06/23/2021 0715  ? ? ?CMP  ?   ?Component Value Date/Time  ? NA 141 10/24/2021 0729  ? NA 143 06/23/2021 0715  ? K 4.1 10/24/2021 0729  ? CL 105 10/24/2021 0729  ? CO2 25 06/23/2021 0715  ? GLUCOSE 93 10/24/2021 0729  ? BUN 20 10/24/2021 0729  ? BUN 21 06/23/2021 0715  ? CREATININE 1.10 10/24/2021 0729  ? CREATININE 1.14 07/26/2015 0852  ? CALCIUM 9.4 06/23/2021 0715  ? PROT 6.7 06/23/2021 0715  ? ALBUMIN 4.4 06/23/2021 0715  ? AST 15 06/23/2021 0715  ? ALT 11 06/23/2021 0715  ? ALKPHOS 66 06/23/2021 0715  ? BILITOT 0.4 06/23/2021 0715  ? GFRNONAA 69 08/31/2020 0809  ? GFRAA 80 08/31/2020 0809  ? ? ? ?ASSESSMENT AND PLAN: ?71 year old male due for average risk screening colonoscopy.  The patient has atrial fibrillation and underwent elective outpatient cardioversion last month.  He is in sinus rhythm today.  He has a follow-up with cardiology next month.  I think he is okay to proceed with a routine screening colonoscopy, but recommended we schedule his colonoscopy for May.  If his cardiologist has further evaluation or has concerns about him proceeding with elective procedure, we can reschedule his colonoscopy.  We will tentatively plan to hold his Xarelto for 2 days prior to his colonoscopy, but defer to cardiology recommendations. ? ?Colon cancer screening ?- Schedule for screening colonoscopy in May ? ?Atrial fibrillation status post elective cardioversion ?- Patient to follow-up with cardiology in April ?- We will adjust colonoscopy plans as needed based on cardiology's follow-up recommendations ?- Tentatively plan to hold Xarelto 2 days prior to colonoscopy ? ? The details, risks (including bleeding, perforation, infection, missed lesions, medication reactions and possible hospitalization or surgery if complications occur), benefits, and alternatives to colonoscopy with possible biopsy and possible polypectomy  were discussed with the patient and he consents to proceed.  ? ?Ethan Holladay E. Candis Schatz, MD ?[facility] ? ? ?CC:  Horald Pollen, * ? ?

## 2021-11-29 NOTE — Patient Instructions (Signed)
If you are age 71 or older, your body mass index should be between 23-30. Your Body mass index is 25.67 kg/m?Marland Kitchen If this is out of the aforementioned range listed, please consider follow up with your Primary Care Provider. ? ?If you are age 71 or younger, your body mass index should be between 19-25. Your Body mass index is 25.67 kg/m?Marland Kitchen If this is out of the aformentioned range listed, please consider follow up with your Primary Care Provider.  ? ?________________________________________________________ ? ?The [facility] providers would like to encourage you to use [facility] to communicate with providers for non-urgent requests or questions.  Due to long hold times on the telephone, sending your provider a message by [facility] may be a faster and more efficient way to get a response.  Please allow 48 business hours for a response.  Please remember that this is for non-urgent requests.  ?_______________________________________________________ ? ?You have been scheduled for a colonoscopy. Please follow written instructions given to you at your visit today.  ?Please pick up your prep supplies at the pharmacy within the next 1-3 days. ?If you use inhalers (even only as needed), please bring them with you on the day of your procedure. ? ?You will be contacted by our office prior to your procedure for directions on holding your XARELTO.  If you do not hear from our office 1 week prior to your scheduled procedure, please call [phone] to discuss.  ? ?Thank you for entrusting me with your care and for choosing [employer], ?Dr. Dustin Flock ? ?  ?

## 2021-11-29 NOTE — Telephone Encounter (Signed)
[facility] Pre-operative Risk Assessment  ?   ?Request for surgical clearance:     Endoscopy Procedure ? ?What type of surgery is being performed?     Colonoscopy  ? ?When is this surgery scheduled?     02/16/22 ? ?What type of clearance is required ?   Pharmacy ? ?Are there any medications that need to be held prior to surgery and how long? Xarelto 2 days. ? ?Practice name and name of physician performing surgery?      [facility] ? ?What is your office phone and fax number?      Phone- [phone]  Fax- [phone] ? ?Anesthesia type (None, local, MAC, general) ?       MAC  ?

## 2021-11-30 NOTE — Telephone Encounter (Signed)
? ?  Primary Cardiologist: Minus Breeding, MD ? ?Chart reviewed as part of pre-operative protocol coverage. Given past medical history and time since last visit, based on ACC/AHA guidelines, Bernd Crom would be at acceptable risk for the planned procedure without further cardiovascular testing.  ? ?Recent successful cardioversion and no further episodes of atrial fibrillation to the patient's knowledge. He passes the required 4 METs minimum for clearance. No other CV symptoms at this time.  ? ?Per pharmacy okay to hold Xarelto 2 days prior to procedure and resume once medically safe to do so.  ? ?Patient was advised that if he develops new symptoms prior to surgery to contact our office to arrange a follow-up appointment.  He verbalized understanding. ? ?I will route to Dr. Percival Spanish since he has an upcoming appointment with him 4/18 which is before is colonoscopy to see if clearance would be appropriate at this time prior to appointment.   ? ?Please call with questions. ? ?Elgie Collard, PA-C ? ?11/30/2021, 10:47 AM ? ? ? ? ?

## 2021-11-30 NOTE — Telephone Encounter (Signed)
Patient with diagnosis of atrial fibrillation on Xarelto for anticoagulation.   ? ?Procedure: colonoscopy ?Date of procedure: 02/16/22 ? ? ?CHA2DS2-VASc Score = 1  ? This indicates a 0.6% annual risk of stroke. ?The patient's score is based upon: ?CHF History: 0 ?HTN History: 0 ?Diabetes History: 0 ?Stroke History: 0 ?Vascular Disease History: 0 ?Age Score: 1 ?Gender Score: 0 ?  ?  ?CrCl 78 ?Platelet count 168 ? ?Per office protocol, patient can hold Xarelto for 2 days prior to procedure.   ?Patient will not need bridging with Lovenox (enoxaparin) around procedure. ? ?

## 2021-12-07 ENCOUNTER — Ambulatory Visit: Payer: 59 | Admitting: Cardiology

## 2021-12-08 ENCOUNTER — Ambulatory Visit (INDEPENDENT_AMBULATORY_CARE_PROVIDER_SITE_OTHER): Payer: 59

## 2021-12-08 ENCOUNTER — Ambulatory Visit: Payer: 59

## 2021-12-08 ENCOUNTER — Other Ambulatory Visit: Payer: Self-pay

## 2021-12-08 DIAGNOSIS — Z23 Encounter for immunization: Secondary | ICD-10-CM | POA: Diagnosis not present

## 2021-12-08 NOTE — Progress Notes (Signed)
Pt here for  2nd shingles ?  ?pt tolerated injection well. ? ? ? ?

## 2022-01-03 ENCOUNTER — Telehealth: Payer: Self-pay | Admitting: Gastroenterology

## 2022-01-03 NOTE — Telephone Encounter (Signed)
Patient called questioning about his Sutab prescription that was sent to [facility].  He is just curious what this is and what or when he's supposed to hear anything from them or how it works.  Please call patient and advise.  Thank you. ?

## 2022-01-03 NOTE — Telephone Encounter (Signed)
Returned patients call and informed him on [employer]. Patient stated that he would call the number provided for [employer] on card given. ?

## 2022-01-14 NOTE — Progress Notes (Signed)
?  ?Cardiology Office Note ? ? ?Date:  01/16/2022  ? ?ID:  Ethan Lee, DOB 1951-02-04, MRN [id number] ? ?PCP:  Ethan Pollen, MD  ?Cardiologist:   Ethan Breeding, MD  ?Referring:  Ethan Lee, * ? ? ?Chief Complaint  ?Patient presents with  ? Fatigue  ? ? ? ?  ?History of Present Illness: ?Ethan Lee is a 71 y.o. male who presents for evaluation of palpitations.  I saw him in 2013 for evaluation of possible mitral valve click and in 3016 for palpitations.   He was diagnosed last year with atrial fib.  He has mild to moderate MR on echo with a normal EF on echo.  I did start Xarelto.  He had cardioversion in January.   ? ?He said he feels well.  He does not really feel his atrial fibrillation.  He would not know that today he is back in fibrillation or when this might of happened post cardioversion.  He is not having any palpitations, presyncope or syncope.  He is not having any new shortness of breath, PND or orthopnea.  He has no chest pressure, neck or arm discomfort.  He does have some fatigue but he did not feel different after the cardioversion and is likely unrelated to the fibrillation. ? ? ?Past Medical History:  ?Diagnosis Date  ? Allergy   ? Hypothyroid   ? ? ?Past Surgical History:  ?Procedure Laterality Date  ? CARDIOVERSION N/A 10/24/2021  ? Procedure: CARDIOVERSION;  Surgeon: Berniece Salines, DO;  Location: [facility];  Service: Cardiovascular;  Laterality: N/A;  ? KNEE SURGERY    ? x 4  ? TONSILLECTOMY AND ADENOIDECTOMY    ? Vocal cord polyps    ? ? ? ?Current Outpatient Medications  ?Medication Sig Dispense Refill  ? cetirizine (ZYRTEC) 10 MG tablet Take 10 mg by mouth at bedtime.    ? cholecalciferol (VITAMIN D) 1000 UNITS tablet Take 1,000 Units by mouth daily.    ? EPINEPHrine 0.3 mg/0.3 mL IJ SOAJ injection Inject 0.3 mg into the muscle as needed for anaphylaxis.    ? levothyroxine (SYNTHROID) 50 MCG tablet TAKE 1 TABLET BY MOUTH  DAILY BEFORE BREAKFAST 90 tablet 3  ?  rivaroxaban (XARELTO) 20 MG TABS tablet Take 1 tablet (20 mg total) by mouth daily with supper. 90 tablet 1  ? vitamin B-12 (CYANOCOBALAMIN) 100 MCG tablet Take 100 mcg by mouth daily.    ? vitamin C (ASCORBIC ACID) 500 MG tablet Take 500 mg by mouth daily. Two tabs    ? vitamin E 400 UNIT capsule Take 400 Units by mouth daily.    ? ?No current facility-administered medications for this visit.  ? ? ?Allergies:   Shellfish allergy  ? ? ?ROS:  Please see the history of present illness.   Otherwise, review of systems are positive for none.   All other systems are reviewed and negative.  ? ? ?PHYSICAL EXAM: ?VS:  BP 128/80 (BP Location: Left Arm, Patient Position: Sitting, Cuff Size: Normal)   Pulse 65   Ht '6\' 1"'$  (1.854 m)   Wt 194 lb (88 kg)   BMI 25.60 kg/m?  , BMI Body mass index is 25.6 kg/m?. ?GENERAL:  Well appearing ?NECK:  No jugular venous distention, waveform within normal limits, carotid upstroke brisk and symmetric, no bruits, no thyromegaly ?LUNGS:  Clear to auscultation bilaterally ?CHEST:  Unremarkable ?HEART:  PMI not displaced or sustained,S1 and S2 within normal limits, no S3, no clicks, no  rubs, no murmurs, irregular ?ABD:  Flat, positive bowel sounds normal in frequency in pitch, no bruits, no rebound, no guarding, no midline pulsatile mass, no hepatomegaly, no splenomegaly ?EXT:  2 plus pulses throughout, no edema, no cyanosis no clubbing ? ?EKG:  EKG is  ordered today. ?The ekg ordered  demonstrates atrial fibrillation, rate 65, left axis deviation, poor anterior R wave progression, borderline first-degree AV block, no acute ST-T wave changes.  There is no change compared to previous.   ? ?Recent Labs: ?06/23/2021: ALT 11; Platelets 168 ?09/07/2021: TSH 5.710 ?10/24/2021: BUN 20; Creatinine, Ser 1.10; Hemoglobin 15.6; Potassium 4.1; Sodium 141  ? ? ?Lipid Panel ?   ?Component Value Date/Time  ? CHOL 155 06/23/2021 0716  ? TRIG 66 06/23/2021 0716  ? HDL 41 06/23/2021 0716  ? CHOLHDL 3.8 06/23/2021  0716  ? CHOLHDL 4.0 07/26/2015 0852  ? VLDL 20 07/26/2015 0852  ? [place] 101 (H) 06/23/2021 0716  ? ?  ? ?Wt Readings from Last 3 Encounters:  ?01/16/22 194 lb (88 kg)  ?11/29/21 194 lb 9.6 oz (88.3 kg)  ?10/24/21 192 lb (87.1 kg)  ?  ? ? ?Other studies Reviewed: ?Additional studies/ records that were reviewed today include: None ?Review of the above records demonstrates:  Please see elsewhere in the note.   ? ? ?ASSESSMENT AND PLAN: ? ? ?ATRIAL FIB: The patient has new onset atrial fibrillation although he has not had a recent EKG.  [name] has a CHA2DS2 - VASc score of 1.   We had a long discussion about this.  He does have some mitral valve prolapse and mitral regurgitation which might be part of the etiology.  I think anticoagulation is at the patient choice here.  He is 71 years old which are his risk factors.   He prefers to stay on anticoagulation understanding of bleeding risk versus reduction and thromboembolic risk.  At this point he has no symptoms related to this and would not choose to pursue further evaluation such as ablation.  ? ?MVP: He has some mitral valve prolapse with mitral regurgitation.  This has progressed slightly over 9 years and I am going to follow this with an echocardiogram in December. ? ?Current medicines are reviewed at length with the patient today.  The patient does not have concerns regarding medicines. ? ?The following changes have been made: None ? ?Labs/ tests ordered today include:    ? ?Orders Placed This Encounter  ?Procedures  ? EKG 12-Lead  ? ECHOCARDIOGRAM COMPLETE  ? ? ? ? ?Disposition:   Follow-up with me in December after the echo ? ?Signed, ?Ethan Breeding, MD  ?01/16/2022 5:03 PM    ?[place] ? ? ? ?

## 2022-01-16 ENCOUNTER — Encounter: Payer: Self-pay | Admitting: Cardiology

## 2022-01-16 ENCOUNTER — Ambulatory Visit (INDEPENDENT_AMBULATORY_CARE_PROVIDER_SITE_OTHER): Payer: 59 | Admitting: Cardiology

## 2022-01-16 VITALS — BP 128/80 | HR 65 | Ht 73.0 in | Wt 194.0 lb

## 2022-01-16 DIAGNOSIS — I4891 Unspecified atrial fibrillation: Secondary | ICD-10-CM

## 2022-01-16 NOTE — Patient Instructions (Addendum)
Medication Instructions:  ?No changes ?*If you need a refill on your cardiac medications before your next appointment, please call your pharmacy* ? ? ?Lab Work: ?None ordered ?If you have labs (blood work) drawn today and your tests are completely normal, you will receive your results only by: ?MyChart Message (if you have MyChart) OR ?A paper copy in the mail ?If you have any lab test that is abnormal or we need to change your treatment, we will call you to review the results. ? ? ?Testing/Procedures: ?Your physician has requested that you have an echocardiogram in December. Echocardiography is a painless test that uses sound waves to create images of your heart. It provides your doctor with information about the size and shape of your heart and how well your heart?s chambers and valves are working. You may receive an ultrasound enhancing agent through an IV if needed to better visualize your heart during the echo.This procedure takes approximately one hour. There are no restrictions for this procedure. This will take place at the [address], [address].  ? ? ? ?Follow-Up: ?At [facility], you and your health needs are our priority.  As part of our continuing mission to provide you with exceptional heart care, we have created designated Provider Care Teams.  These Care Teams include your primary Cardiologist (physician) and Advanced Practice Providers (APPs -  Physician Assistants and Nurse Practitioners) who all work together to provide you with the care you need, when you need it. ? ?We recommend signing up for the patient portal called "MyChart".  Sign up information is provided on this After Visit Summary.  MyChart is used to connect with patients for Virtual Visits (Telemedicine).  Patients are able to view lab/test results, encounter notes, upcoming appointments, etc.  Non-urgent messages can be sent to your provider as well.   ?To learn more about what you can do with MyChart, go to  [email or web address].   ? ?Your next appointment:   ?December ? ?The format for your next appointment:   ?In Person ? ?Provider:   ?Minus Breeding, MD { ? ?Important Information About Sugar ? ? ? ? ? ? ?

## 2022-02-12 ENCOUNTER — Telehealth: Payer: Self-pay | Admitting: Gastroenterology

## 2022-02-12 NOTE — Telephone Encounter (Signed)
Spoke with pt and let him know he is to hold his xarelto for 2 days prior to procedure. Pt verbalized understanding. ?

## 2022-02-12 NOTE — Telephone Encounter (Signed)
Patient called wanting to know when he was supposed to hold his Xarelto for his upcoming procedure on Friday.  Please call and advise.  Thank you. ?

## 2022-02-16 ENCOUNTER — Encounter: Payer: Self-pay | Admitting: Gastroenterology

## 2022-02-16 ENCOUNTER — Ambulatory Visit (AMBULATORY_SURGERY_CENTER): Payer: 59 | Admitting: Gastroenterology

## 2022-02-16 VITALS — BP 111/66 | HR 62 | Temp 97.3°F | Resp 13 | Ht 73.0 in | Wt 194.0 lb

## 2022-02-16 DIAGNOSIS — D120 Benign neoplasm of cecum: Secondary | ICD-10-CM | POA: Diagnosis not present

## 2022-02-16 DIAGNOSIS — D123 Benign neoplasm of transverse colon: Secondary | ICD-10-CM

## 2022-02-16 DIAGNOSIS — Z1211 Encounter for screening for malignant neoplasm of colon: Secondary | ICD-10-CM

## 2022-02-16 MED ORDER — SODIUM CHLORIDE 0.9 % IV SOLN: 500.0000 mL | Freq: Once | INTRAVENOUS | Status: DC

## 2022-02-16 NOTE — Progress Notes (Signed)
Called to room to assist during endoscopic procedure.  Patient ID and intended procedure confirmed with present staff. Received instructions for my participation in the procedure from the performing physician.  

## 2022-02-16 NOTE — Progress Notes (Signed)
[facility] History and Physical   Primary Care Physician:  Horald Pollen, MD   Reason for Procedure:   Colon cancer screening  Plan:    Screening colonoscopy     HPI: Ethan Lee is a 71 y.o. male undergoing average risk screening colonoscopy.  He has no family history of colon cancer and no chronic GI symptoms.  He had a normal colonoscopy in 2013.  He takes Xarelto for a-fib, last dose 2 days ago.   Past Medical History:  Diagnosis Date   Allergy    Hypothyroid     Past Surgical History:  Procedure Laterality Date   CARDIOVERSION N/A 10/24/2021   Procedure: CARDIOVERSION;  Surgeon: Berniece Salines, DO;  Location: [facility];  Service: Cardiovascular;  Laterality: N/A;   KNEE SURGERY     x 4   TONSILLECTOMY AND ADENOIDECTOMY     Vocal cord polyps      Prior to Admission medications   Medication Sig Start Date End Date Taking? Authorizing Provider  cetirizine (ZYRTEC) 10 MG tablet Take 10 mg by mouth at bedtime.   Yes [provider]  cholecalciferol (VITAMIN D) 1000 UNITS tablet Take 1,000 Units by mouth daily.   Yes [provider]  levothyroxine (SYNTHROID) 50 MCG tablet TAKE 1 TABLET BY MOUTH  DAILY BEFORE BREAKFAST 09/07/21  Yes Sagardia, Ines Bloomer, MD  vitamin B-12 (CYANOCOBALAMIN) 100 MCG tablet Take 100 mcg by mouth daily.   Yes [provider]  vitamin C (ASCORBIC ACID) 500 MG tablet Take 500 mg by mouth daily. Two tabs   Yes [provider]  vitamin E 400 UNIT capsule Take 400 Units by mouth daily.   Yes [provider]  EPINEPHrine 0.3 mg/0.3 mL IJ SOAJ injection Inject 0.3 mg into the muscle as needed for anaphylaxis. 04/20/21   [provider]  rivaroxaban (XARELTO) 20 MG TABS tablet Take 1 tablet (20 mg total) by mouth daily with supper. 09/20/21   Minus Breeding, MD    Current Outpatient Medications  Medication Sig Dispense Refill   cetirizine (ZYRTEC) 10 MG tablet Take 10 mg  by mouth at bedtime.     cholecalciferol (VITAMIN D) 1000 UNITS tablet Take 1,000 Units by mouth daily.     levothyroxine (SYNTHROID) 50 MCG tablet TAKE 1 TABLET BY MOUTH  DAILY BEFORE BREAKFAST 90 tablet 3   vitamin B-12 (CYANOCOBALAMIN) 100 MCG tablet Take 100 mcg by mouth daily.     vitamin C (ASCORBIC ACID) 500 MG tablet Take 500 mg by mouth daily. Two tabs     vitamin E 400 UNIT capsule Take 400 Units by mouth daily.     EPINEPHrine 0.3 mg/0.3 mL IJ SOAJ injection Inject 0.3 mg into the muscle as needed for anaphylaxis.     rivaroxaban (XARELTO) 20 MG TABS tablet Take 1 tablet (20 mg total) by mouth daily with supper. 90 tablet 1   Current Facility-Administered Medications  Medication Dose Route Frequency Provider Last Rate Last Admin   0.9 %  sodium chloride infusion  500 mL Intravenous Once Daryel November, MD        Allergies as of 02/16/2022 - Review Complete 02/16/2022  Allergen Reaction Noted   Shellfish allergy Hives 06/30/2012    Family History  Problem Relation Age of Onset   Cancer Mother        COLON CANCER   Leukemia Father    Other Father        blood disorder   Cancer Brother  testicular cancer   Cancer Brother        liver/kidney cancer    Social History   Socioeconomic History   Marital status: Married    Spouse name: Not on file   Number of children: 2   Years of education: Not on file   Highest education level: Not on file  Occupational History    Employer: [employer]  Tobacco Use   Smoking status: Never   Smokeless tobacco: Never  Substance and Sexual Activity   Alcohol use: Yes    Alcohol/week: 3.0 standard drinks    Types: 3 Glasses of wine per week   Drug use: No   Sexual activity: Yes  Other Topics Concern   Not on file  Social History Narrative   Lives with wife and works at [employer]  granddaughters.       Social Determinants of Health   Financial Resource Strain: Not on file  Food Insecurity: Not on file   Transportation Needs: Not on file  Physical Activity: Not on file  Stress: Not on file  Social Connections: Not on file  Intimate Partner Violence: Not on file    Review of Systems:  All other review of systems negative except as mentioned in the HPI.  Physical Exam: Vital signs BP 117/76 (BP Location: Right Arm, Patient Position: Sitting, Cuff Size: Normal)   Pulse 83   Temp (!) 97.3 F (36.3 C) (Temporal)   Ht '6\' 1"'$  (1.854 m)   Wt 194 lb (88 kg)   SpO2 100%   BMI 25.60 kg/m   General:   Alert,  Well-developed, well-nourished, pleasant and cooperative in NAD Airway:  Mallampati 2 Lungs:  Clear throughout to auscultation.   Heart:  Regular rate and rhythm; no murmurs, clicks, rubs,  or gallops. Abdomen:  Soft, nontender and nondistended. Normal bowel sounds.   Neuro/Psych:  Normal mood and affect. A and O x 3   Samona Chihuahua E. Candis Schatz, MD [facility]

## 2022-02-16 NOTE — Progress Notes (Signed)
Vitals-DT  History reviewed. 

## 2022-02-16 NOTE — Op Note (Signed)
[facility] Patient Name: Ethan Lee Procedure Date: 02/16/2022 10:17 AM MRN: [id number] Endoscopist: Nicki Reaper E. Candis Schatz , MD Age: 71 Referring MD:  Date of Birth: [birth year] Gender: Male Account #: [id number] Procedure:                Colonoscopy Indications:              Screening for colorectal malignant neoplasm (last                            colonoscopy was 10 years ago) Medicines:                Monitored Anesthesia Care Procedure:                Pre-Anesthesia Assessment:                           - Prior to the procedure, a History and Physical                            was performed, and patient medications and                            allergies were reviewed. The patient's tolerance of                            previous anesthesia was also reviewed. The risks                            and benefits of the procedure and the sedation                            options and risks were discussed with the patient.                            All questions were answered, and informed consent                            was obtained. Prior Anticoagulants: The patient has                            taken Xarelto (rivaroxaban), last dose was 2 days                            prior to procedure. ASA Grade Assessment: II - A                            patient with mild systemic disease. After reviewing                            the risks and benefits, the patient was deemed in                            satisfactory condition to undergo the procedure.  After obtaining informed consent, the colonoscope                            was passed under direct vision. Throughout the                            procedure, the patient's blood pressure, pulse, and                            oxygen saturations were monitored continuously. The                            CF [id number] #[id number] was introduced through the anus                            and  advanced to the the terminal ileum, with                            identification of the appendiceal orifice and IC                            valve. The colonoscopy was performed without                            difficulty. The patient tolerated the procedure                            well. The quality of the bowel preparation was                            good. The terminal ileum, ileocecal valve,                            appendiceal orifice, and rectum were photographed.                            The bowel preparation used was Sutab via split dose                            instruction. Scope In: 10:32:01 AM Scope Out: 10:56:47 AM Scope Withdrawal Time: 0 hours 18 minutes 59 seconds  Total Procedure Duration: 0 hours 24 minutes 46 seconds  Findings:                 The perianal and digital rectal examinations were                            normal. Pertinent negatives include normal                            sphincter tone and no palpable rectal lesions.                           A 2 mm polyp was found in the cecum. The polyp was  sessile. The polyp was removed with a cold biopsy                            forceps. Resection and retrieval were complete.                            Estimated blood loss was minimal.                           A 6 mm polyp was found in the ileocecal valve. The                            polyp was sessile. The polyp was removed with a                            cold snare. Resection and retrieval were complete.                            Estimated blood loss was minimal.                           Three sessile polyps were found in the transverse                            colon. The polyps were 4 to 6 mm in size. These                            polyps were removed with a cold snare. Resection                            and retrieval were complete. Estimated blood loss                            was minimal.                            The exam was otherwise normal throughout the                            examined colon.                           The terminal ileum appeared normal.                           Non-bleeding internal hemorrhoids were found during                            retroflexion. The hemorrhoids were Grade I                            (internal hemorrhoids that do not prolapse).  No additional abnormalities were found on                            retroflexion. Complications:            No immediate complications. Estimated Blood Loss:     Estimated blood loss was minimal. Impression:               - One 2 mm polyp in the cecum, removed with a cold                            biopsy forceps. Resected and retrieved.                           - One 6 mm polyp at the ileocecal valve, removed                            with a cold snare. Resected and retrieved.                           - Three 4 to 6 mm polyps in the transverse colon,                            removed with a cold snare. Resected and retrieved.                           - The examined portion of the ileum was normal.                           - Non-bleeding internal hemorrhoids. Recommendation:           - Patient has a contact number available for                            emergencies. The signs and symptoms of potential                            delayed complications were discussed with the                            patient. Return to normal activities tomorrow.                            Written discharge instructions were provided to the                            patient.                           - Resume previous diet.                           - Continue present medications.                           - Await pathology results.                           -  Repeat colonoscopy (date not yet determined) for                            surveillance based on pathology results.                            - Resume Xarelto on Sunday. Shonette Rhames E. Candis Schatz, MD 02/16/2022 11:03:17 AM This report has been signed electronically.

## 2022-02-16 NOTE — Progress Notes (Signed)
No problems noted in the recovery room. maw   Pt and his wife were instructed to restart XARELTO on Monday, Feb 18, 2022.  maw

## 2022-02-16 NOTE — Progress Notes (Signed)
A and O x3. Report to RN. Tolerated MAC anesthesia well. 

## 2022-02-16 NOTE — Patient Instructions (Addendum)
Handouts were given to your care partner on polyps and diverticulosis. Resume your XARELTO on Sunday, Feb 18, 2022. You may resume your other current medications today. Await biopsy results.  May take 1-3 weeks to receive pathology results. Please call if any questions or concerns.      YOU HAD AN ENDOSCOPIC PROCEDURE TODAY AT [facility]:   Refer to the procedure report that was given to you for any specific questions about what was found during the examination.  If the procedure report does not answer your questions, please call your gastroenterologist to clarify.  If you requested that your care partner not be given the details of your procedure findings, then the procedure report has been included in a sealed envelope for you to review at your convenience later.  YOU SHOULD EXPECT: Some feelings of bloating in the abdomen. Passage of more gas than usual.  Walking can help get rid of the air that was put into your GI tract during the procedure and reduce the bloating. If you had a lower endoscopy (such as a colonoscopy or flexible sigmoidoscopy) you may notice spotting of blood in your stool or on the toilet paper. If you underwent a bowel prep for your procedure, you may not have a normal bowel movement for a few days.  Please Note:  You might notice some irritation and congestion in your nose or some drainage.  This is from the oxygen used during your procedure.  There is no need for concern and it should clear up in a day or so.  SYMPTOMS TO REPORT IMMEDIATELY:  Following lower endoscopy (colonoscopy or flexible sigmoidoscopy):  Excessive amounts of blood in the stool  Significant tenderness or worsening of abdominal pains  Swelling of the abdomen that is new, acute  Fever of 100F or higher   For urgent or emergent issues, a gastroenterologist can be reached at any hour by calling [phone]. Do not use MyChart messaging for urgent concerns.    DIET:  We do  recommend a small meal at first, but then you may proceed to your regular diet.  Drink plenty of fluids but you should avoid alcoholic beverages for 24 hours.  ACTIVITY:  You should plan to take it easy for the rest of today and you should NOT DRIVE or use heavy machinery until tomorrow (because of the sedation medicines used during the test).    FOLLOW UP: Our staff will call the number listed on your records 48-72 hours following your procedure to check on you and address any questions or concerns that you may have regarding the information given to you following your procedure. If we do not reach you, we will leave a message.  We will attempt to reach you two times.  During this call, we will ask if you have developed any symptoms of COVID 19. If you develop any symptoms (ie: fever, flu-like symptoms, shortness of breath, cough etc.) before then, please call [phone].  If you test positive for Covid 19 in the 2 weeks post procedure, please call and report this information to [place].    If any biopsies were taken you will be contacted by phone or by letter within the next 1-3 weeks.  Please call us at [phone] if you have not heard about the biopsies in 3 weeks.    SIGNATURES/CONFIDENTIALITY: You and/or your care partner have signed paperwork which will be entered into your electronic medical record.  These signatures attest to the fact  that that the information above on your After Visit Summary has been reviewed and is understood.  Full responsibility of the confidentiality of this discharge information lies with you and/or your care-partner.

## 2022-02-19 ENCOUNTER — Telehealth: Payer: Self-pay

## 2022-02-19 ENCOUNTER — Telehealth: Payer: Self-pay | Admitting: *Deleted

## 2022-02-19 NOTE — Telephone Encounter (Signed)
  Follow up Call-     02/16/2022    9:27 AM  Call back number  Post procedure Call Back phone  # [phone]  Permission to leave phone message Yes     Patient questions:  Do you have a fever, pain , or abdominal swelling? No. Pain Score  0 *  Have you tolerated food without any problems? Yes.    Have you been able to return to your normal activities? Yes.    Do you have any questions about your discharge instructions: Diet   No. Medications  No. Follow up visit  No.  Do you have questions or concerns about your Care? No.  Actions: * If pain score is 4 or above: No action needed, pain <4.

## 2022-02-19 NOTE — Telephone Encounter (Signed)
First attempt follow up call to pt, lm for pt to call if having any problems or questions, otherwise we will call them back later this morning or early this afternoon.  °

## 2022-02-20 ENCOUNTER — Other Ambulatory Visit: Payer: Self-pay | Admitting: Cardiology

## 2022-02-20 NOTE — Telephone Encounter (Signed)
Prescription refill request for Xarelto received.  Indication:Afib Last office visit:4/23 Weight:88 kg Age:71 Scr:1.1 CrCl:77.78  Prescription refilled

## 2022-02-21 NOTE — Progress Notes (Signed)
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  Studies shows that at least 20% of women over age 71 and 30% of men over age 32 have pre-cancerous polyps.  Based on current nationally recognized surveillance guidelines, I recommend that you have a repeat colonoscopy in 5 years.   If you develop any new rectal bleeding, abdominal pain or significant bowel habit changes, please contact me before then.

## 2022-04-24 ENCOUNTER — Other Ambulatory Visit: Payer: Self-pay | Admitting: Emergency Medicine

## 2022-04-24 DIAGNOSIS — E039 Hypothyroidism, unspecified: Secondary | ICD-10-CM

## 2022-04-24 DIAGNOSIS — I4891 Unspecified atrial fibrillation: Secondary | ICD-10-CM

## 2022-04-24 NOTE — Telephone Encounter (Signed)
Okay to have labs done at the requested location.

## 2022-04-30 ENCOUNTER — Telehealth: Payer: Self-pay | Admitting: Emergency Medicine

## 2022-04-30 NOTE — Telephone Encounter (Signed)
Patient would like his latest lab results faxed to :  [phone]

## 2022-05-01 NOTE — Telephone Encounter (Signed)
Lab results was faxed to number provided by Lorre Nick, CMA yesterday

## 2022-05-02 ENCOUNTER — Telehealth: Payer: Self-pay | Admitting: *Deleted

## 2022-05-02 ENCOUNTER — Telehealth: Payer: Self-pay

## 2022-05-02 NOTE — Telephone Encounter (Signed)
Pt has been scheduled for a tele visit, 05/15/22 2:00.  Consent on file / medications reconciled.    Patient Consent for Virtual Visit        Ethan Lee has provided verbal consent on 05/02/2022 for a virtual visit (video or telephone).   CONSENT FOR VIRTUAL VISIT FOR:  Ethan Lee  By participating in this virtual visit I agree to the following:  I hereby voluntarily request, consent and authorize [facility] and its employed or contracted physicians, physician assistants, nurse practitioners or other licensed health care professionals (the Practitioner), to provide me with telemedicine health care services (the "Services") as deemed necessary by the treating Practitioner. I acknowledge and consent to receive the Services by the Practitioner via telemedicine. I understand that the telemedicine visit will involve communicating with the Practitioner through live audiovisual communication technology and the disclosure of certain medical information by electronic transmission. I acknowledge that I have been given the opportunity to request an in-person assessment or other available alternative prior to the telemedicine visit and am voluntarily participating in the telemedicine visit.  I understand that I have the right to withhold or withdraw my consent to the use of telemedicine in the course of my care at any time, without affecting my right to future care or treatment, and that the Practitioner or I may terminate the telemedicine visit at any time. I understand that I have the right to inspect all information obtained and/or recorded in the course of the telemedicine visit and may receive copies of available information for a reasonable fee.  I understand that some of the potential risks of receiving the Services via telemedicine include:  Delay or interruption in medical evaluation due to technological equipment failure or disruption; Information transmitted may not be sufficient  (e.g. poor resolution of images) to allow for appropriate medical decision making by the Practitioner; and/or  In rare instances, security protocols could fail, causing a breach of personal health information.  Furthermore, I acknowledge that it is my responsibility to provide information about my medical history, conditions and care that is complete and accurate to the best of my ability. I acknowledge that Practitioner's advice, recommendations, and/or decision may be based on factors not within their control, such as incomplete or inaccurate data provided by me or distortions of diagnostic images or specimens that may result from electronic transmissions. I understand that the practice of medicine is not an exact science and that Practitioner makes no warranties or guarantees regarding treatment outcomes. I acknowledge that a copy of this consent can be made available to me via my patient portal ([facility]), or I can request a printed copy by calling the office of [facility].    I understand that my insurance will be billed for this visit.   I have read or had this consent read to me. I understand the contents of this consent, which adequately explains the benefits and risks of the Services being provided via telemedicine.  I have been provided ample opportunity to ask questions regarding this consent and the Services and have had my questions answered to my satisfaction. I give my informed consent for the services to be provided through the use of telemedicine in my medical care

## 2022-05-02 NOTE — Telephone Encounter (Signed)
Pt has been scheduled for a tele visit, 05/15/22 2:00.

## 2022-05-02 NOTE — Telephone Encounter (Signed)
   Pre-operative Risk Assessment    Patient Name: Ethan Lee  DOB: 1951-04-23 MRN: [id number]      Request for Surgical Clearance    Procedure:   LEFT KNEE ARTHROPLASTY  Date of Surgery:  Clearance TBD                                 Surgeon:  DR Melrose Nakayama Surgeon's Group or Practice Name:  [facility] Phone number:  [phone] Fax number:  [phone]   Type of Clearance Requested:   - Medical    Type of Anesthesia:  Not Indicated   Additional requests/questions:  Please fax a copy of form to fax [phone] to the surgeon's office.  Signed, Jeanmarie Plant Alexina Niccoli  CCMA 05/02/2022, 11:55 AM

## 2022-05-03 NOTE — Telephone Encounter (Signed)
Patient with diagnosis of atrial fibrillation on Xarelto for anticoagulation.    Procedure: left knee arthroplasty  Date of procedure: TBD   CHA2DS2-VASc Score = 1   This indicates a 0.6% annual risk of stroke. The patient's score is based upon: CHF History: 0 HTN History: 0 Diabetes History: 0 Stroke History: 0 Vascular Disease History: 0 Age Score: 1 Gender Score: 0      CrCl 62 mL/min (Scr 1.10 10/24/21) using ideal BW   Per office protocol, patient can hold Xarelto for 3 days prior to procedure.   Patient will not need bridging with Lovenox (enoxaparin) around procedure.  **This guidance is not considered finalized until pre-operative APP has relayed final recommendations.**

## 2022-05-08 ENCOUNTER — Telehealth: Payer: Self-pay

## 2022-05-08 NOTE — Telephone Encounter (Signed)
Wells Guiles with [facility] wanting to know if the pts surgery clearance form has been received. She has stated she is unable to sched the pt surgery w/o this form being signed.  Form can be faxed to wither of these numbers as they have been having issues with faxes lately [phone] or [phone].

## 2022-05-08 NOTE — Telephone Encounter (Signed)
Surgical form faxed to [facility]

## 2022-05-08 NOTE — Telephone Encounter (Signed)
[facility] is going to resend the surgical clearance form to be signed

## 2022-05-15 ENCOUNTER — Ambulatory Visit (INDEPENDENT_AMBULATORY_CARE_PROVIDER_SITE_OTHER): Payer: 59 | Admitting: Nurse Practitioner

## 2022-05-15 DIAGNOSIS — Z01810 Encounter for preprocedural cardiovascular examination: Secondary | ICD-10-CM | POA: Diagnosis not present

## 2022-05-15 NOTE — Progress Notes (Signed)
Virtual Visit via Telephone Note   Because of Ethan Lee co-morbid illnesses, he is at least at moderate risk for complications without adequate follow up.  This format is felt to be most appropriate for this patient at this time.  The patient did not have access to video technology/had technical difficulties with video requiring transitioning to audio format only (telephone).  All issues noted in this document were discussed and addressed.  No physical exam could be performed with this format.  Please refer to the patient's chart for his consent to telehealth for [facility].  Evaluation Performed:  Preoperative cardiovascular risk assessment _____________   Date:  05/15/2022   Patient ID:  Ethan Lee, DOB August 13, 1951, MRN [id number] Patient Location:  Home Provider location:   Office  Primary Care Provider:  Horald Pollen, MD Primary Cardiologist:  Minus Breeding, MD  Chief Complaint / Patient Profile   71 y.o. y/o male with a h/o paroxysmal atrial fibrillation, mitral valve prolapse with mitral valve regurgitation, and hypothyroidism who is pending left knee arthroplasty, date TBD, with Dr. Joseph Art of [place] [place] and presents today for telephonic preoperative cardiovascular risk assessment.  Past Medical History    Past Medical History:  Diagnosis Date   Allergy    Hypothyroid    Past Surgical History:  Procedure Laterality Date   CARDIOVERSION N/A 10/24/2021   Procedure: CARDIOVERSION;  Surgeon: Berniece Salines, DO;  Location: [facility];  Service: Cardiovascular;  Laterality: N/A;   KNEE SURGERY     x 4   TONSILLECTOMY AND ADENOIDECTOMY     Vocal cord polyps      Allergies  Allergies  Allergen Reactions   Shellfish Allergy Hives    History of Present Illness    Ethan Lee is a 71 y.o. male who presents via audio/video conferencing for a telehealth visit today.  Pt was last seen in cardiology clinic on 01/16/2022 by  Dr. Percival Spanish.  At that time Ethan Lee was doing well. The patient is now pending procedure as outlined above. Since his last visit, he has done well form a cardiac standpoint. He is active and exercises regularly without difficulty.He denies chest pain, palpitations, dyspnea, pnd, orthopnea, n, v, dizziness, syncope, edema, weight gain, or early satiety. All other systems reviewed and are otherwise negative except as noted above.   Home Medications    Prior to Admission medications   Medication Sig Start Date End Date Taking? Authorizing Provider  cetirizine (ZYRTEC) 10 MG tablet Take 10 mg by mouth at bedtime.    [provider]  cholecalciferol (VITAMIN D) 1000 UNITS tablet Take 1,000 Units by mouth daily.    [provider]  EPINEPHrine 0.3 mg/0.3 mL IJ SOAJ injection Inject 0.3 mg into the muscle as needed for anaphylaxis. 04/20/21   [provider]  levothyroxine (SYNTHROID) 50 MCG tablet TAKE 1 TABLET BY MOUTH  DAILY BEFORE BREAKFAST 09/07/21   Sagardia, Ines Bloomer, MD  vitamin B-12 (CYANOCOBALAMIN) 100 MCG tablet Take 100 mcg by mouth daily.    [provider]  vitamin C (ASCORBIC ACID) 500 MG tablet Take 500 mg by mouth daily. Two tabs    [provider]  vitamin E 400 UNIT capsule Take 400 Units by mouth daily.    [provider]  XARELTO 20 MG TABS tablet TAKE 1 TABLET BY MOUTH DAILY  WITH SUPPER 02/20/22   Minus Breeding, MD    Physical Exam    Vital Signs:  Robbert Langlinais does not  have vital signs available for review today.  Given telephonic nature of communication, physical exam is limited. AAOx3. NAD. Normal affect.  Speech and respirations are unlabored.  Accessory Clinical Findings    None  Assessment & Plan    1.  Preoperative Cardiovascular Risk Assessment:  According to the Revised Cardiac Risk Index (RCRI), his Perioperative Risk of Major Cardiac Event is (%): 0.4. His Functional Capacity in METs is:  7.34 according to the [employer] Activity Status Index (DASI). Therefore, based on ACC/AHA guidelines, patient would be at acceptable risk for the planned procedure without further cardiovascular testing.   Patient with diagnosis of atrial fibrillation on Xarelto for anticoagulation.     Procedure: left knee arthroplasty  Date of procedure: TBD     CHA2DS2-VASc Score = 1   This indicates a 0.6% annual risk of stroke. The patient's score is based upon: CHF History: 0 HTN History: 0 Diabetes History: 0 Stroke History: 0 Vascular Disease History: 0 Age Score: 1 Gender Score: 0      CrCl 62 mL/min (Scr 1.10 10/24/21) using ideal BW    Per office protocol, patient can hold Xarelto for 3 days prior to procedure. Patient will not need bridging with Lovenox (enoxaparin) around procedure.    A copy of this note will be routed to requesting surgeon.  Time:   Today, I have spent 5 minutes with the patient with telehealth technology discussing medical history, symptoms, and management plan.     Lenna Sciara, NP  05/15/2022, 2:14 PM

## 2022-06-21 ENCOUNTER — Encounter: Payer: Self-pay | Admitting: Nurse Practitioner

## 2022-06-21 ENCOUNTER — Ambulatory Visit: Payer: 59 | Attending: Nurse Practitioner | Admitting: Nurse Practitioner

## 2022-06-21 VITALS — BP 124/72 | HR 81 | Ht 73.0 in | Wt 193.4 lb

## 2022-06-21 DIAGNOSIS — I341 Nonrheumatic mitral (valve) prolapse: Secondary | ICD-10-CM | POA: Diagnosis not present

## 2022-06-21 DIAGNOSIS — I340 Nonrheumatic mitral (valve) insufficiency: Secondary | ICD-10-CM | POA: Diagnosis not present

## 2022-06-21 DIAGNOSIS — I480 Paroxysmal atrial fibrillation: Secondary | ICD-10-CM | POA: Diagnosis not present

## 2022-06-21 DIAGNOSIS — E039 Hypothyroidism, unspecified: Secondary | ICD-10-CM | POA: Diagnosis not present

## 2022-06-21 DIAGNOSIS — D6859 Other primary thrombophilia: Secondary | ICD-10-CM

## 2022-06-21 LAB — CBC
Hematocrit: 43.7 % (ref 37.5–51.0)
Hemoglobin: 14.9 g/dL (ref 13.0–17.7)
MCH: 29.5 pg (ref 26.6–33.0)
MCHC: 34.1 g/dL (ref 31.5–35.7)
MCV: 87 fL (ref 79–97)
Platelets: 163 10*3/uL (ref 150–450)
RBC: 5.05 x10E6/uL (ref 4.14–5.80)
RDW: 12.4 % (ref 11.6–15.4)
WBC: 4.6 10*3/uL (ref 3.4–10.8)

## 2022-06-21 NOTE — Patient Instructions (Signed)
Medication Instructions:  Your physician recommends that you continue on your current medications as directed. Please refer to the Current Medication list given to you today.   *If you need a refill on your cardiac medications before your next appointment, please call your pharmacy*   Lab Work: Your physician recommends that you complete labs today CBC  If you have labs (blood work) drawn today and your tests are completely normal, you will receive your results only by: [facility] (if you have MyChart) OR A paper copy in the mail If you have any lab test that is abnormal or we need to change your treatment, we will call you to review the results.   Testing/Procedures: NONE ordered at this time of appointment     Follow-Up: At [facility], you and your health needs are our priority.  As part of our continuing mission to provide you with exceptional heart care, we have created designated Provider Care Teams.  These Care Teams include your primary Cardiologist (physician) and Advanced Practice Providers (APPs -  Physician Assistants and Nurse Practitioners) who all work together to provide you with the care you need, when you need it.  We recommend signing up for the patient portal called "MyChart".  Sign up information is provided on this After Visit Summary.  MyChart is used to connect with patients for Virtual Visits (Telemedicine).  Patients are able to view lab/test results, encounter notes, upcoming appointments, etc.  Non-urgent messages can be sent to your provider as well.   To learn more about what you can do with MyChart, go to [email or web address].    Your next appointment:   3-4 month(s)  The format for your next appointment:   In Person  Provider:   Minus Breeding, MD     Other Instructions   Important Information About [place]

## 2022-06-21 NOTE — Progress Notes (Signed)
Office Visit    Patient Name: Ethan Lee Date of Encounter: 06/21/2022  Primary Care Provider:  Horald Pollen, MD Primary Cardiologist:  Minus Breeding, MD  Chief Complaint    71 year old male with a history of paroxysmal atrial fibrillation, mitral valve prolapse with mitral valve regurgitation, and hypothyroidism who presents for follow-up related to atrial fibrillation.  Past Medical History    Past Medical History:  Diagnosis Date   Allergy    Hypothyroid    Past Surgical History:  Procedure Laterality Date   CARDIOVERSION N/A 10/24/2021   Procedure: CARDIOVERSION;  Surgeon: Berniece Salines, DO;  Location: [facility];  Service: Cardiovascular;  Laterality: N/A;   KNEE SURGERY     x 4   TONSILLECTOMY AND ADENOIDECTOMY     Vocal cord polyps      Allergies  Allergies  Allergen Reactions   Shellfish Allergy Hives    History of Present Illness    71 year old male with the above past medical history including paroxysmal atrial fibrillation, mitral valve prolapse with mitral valve regurgitation, and hypothyroidism who presents for follow-up related to atrial fibrillation.  He was initially evaluated by Dr. Percival Spanish in 2013 for possible mitral valve click.  Time showed normal LV function, trivial mitral valve regurgitation.  He was evaluated in 2018 in the setting of palpitations.  He was diagnosed with atrial fibrillation in 2022.  Echocardiogram in November 2022 showed EF 60 to 65%, normal LV function, diastolic parameters, normal RV systolic function, mildly dilated left atrium, mitral valve prolapse with mild to moderate mitral valve regurgitation.  Repeat echocardiogram was recommended in December 2023.  Cardiac monitor in January 2023 showed persistent atrial fibrillation.  He was started on Xarelto and underwent DCCV in January 2023. He was last seen virtually on 05/15/2022 for preoperative cardiac evaluation.  He was cleared for knee surgery.  He presents  today for follow-up.  Since his last visit he has been stable from a cardiac standpoint.  However, over the past 2 months he has noticed what appears to be asymptomatic petechiae covering his bilateral lower extremities (calves and shins). He denies any recent illness, fevers, vaccinations, medication changes, insect bites, pain, itching, swelling, claudication, injury, or bleeding.  His dermatologist advised him to switch soaps.  However, he has not seen any improvement with this.  Overall, he reports feeling well and other than the discoloration to his bilateral lower extremities, he denies any additional concerns today.   Home Medications    Current Outpatient Medications  Medication Sig Dispense Refill   cetirizine (ZYRTEC) 10 MG tablet Take 10 mg by mouth at bedtime.     cholecalciferol (VITAMIN D) 1000 UNITS tablet Take 1,000 Units by mouth daily.     EPINEPHrine 0.3 mg/0.3 mL IJ SOAJ injection Inject 0.3 mg into the muscle as needed for anaphylaxis.     levothyroxine (SYNTHROID) 50 MCG tablet TAKE 1 TABLET BY MOUTH  DAILY BEFORE BREAKFAST 90 tablet 3   vitamin B-12 (CYANOCOBALAMIN) 100 MCG tablet Take 100 mcg by mouth daily.     vitamin C (ASCORBIC ACID) 500 MG tablet Take 500 mg by mouth daily. Two tabs     vitamin E 400 UNIT capsule Take 400 Units by mouth daily.     XARELTO 20 MG TABS tablet TAKE 1 TABLET BY MOUTH DAILY  WITH SUPPER 90 tablet 3   No current facility-administered medications for this visit.     Review of Systems    He denies chest pain, palpitations,  dyspnea, pnd, orthopnea, n, v, dizziness, syncope, edema, weight gain, or early satiety. All other systems reviewed and are otherwise negative except as noted above.   Physical Exam    VS:  BP 124/72   Pulse 81   Ht '6\' 1"'$  (1.854 m)   Wt 193 lb 6.4 oz (87.7 kg)   SpO2 99%   BMI 25.52 kg/m   GEN: Well nourished, well developed, in no acute distress. HEENT: normal. Neck: Supple, no JVD, carotid bruits, or  masses. Cardiac: RRR, no murmurs, rubs, or gallops. No clubbing, cyanosis, edema.  Radials/DP/PT 2+ and equal bilaterally.  Respiratory:  Respirations regular and unlabored, clear to auscultation bilaterally. GI: Soft, nontender, nondistended, BS + x 4. MS: no deformity or atrophy. Skin: warm and dry, no rash.  Diffuse petechiae to bilateral lower extremities. Neuro:  Strength and sensation are intact. Psych: Normal affect.  Accessory Clinical Findings    ECG personally reviewed by me today - No EKG in office today.    Lab Results  Component Value Date   WBC 4.6 06/23/2021   HGB 15.6 10/24/2021   HCT 46.0 10/24/2021   MCV 84 06/23/2021   PLT 168 06/23/2021   Lab Results  Component Value Date   CREATININE 1.10 10/24/2021   BUN 20 10/24/2021   NA 141 10/24/2021   K 4.1 10/24/2021   CL 105 10/24/2021   CO2 25 06/23/2021   Lab Results  Component Value Date   ALT 11 06/23/2021   AST 15 06/23/2021   ALKPHOS 66 06/23/2021   BILITOT 0.4 06/23/2021   Lab Results  Component Value Date   CHOL 155 06/23/2021   HDL 41 06/23/2021   LDLCALC 101 (H) 06/23/2021   TRIG 66 06/23/2021   CHOLHDL 3.8 06/23/2021    Lab Results  Component Value Date   HGBA1C 5.7 (H) 06/23/2021    Assessment & Plan    1. Bilateral lower extremity petechiae: He has noticed over the past 2 months asymptomatic petechiae covering his bilateral lower extremities (calves and shins).    Denies recent illness, fevers, vaccinations, medication changes, insect bites, pain, itching, swelling, injury, bleeding, or claudication.  He has strong DP pulses bilaterally.  No alarm symptoms. His dermatologist advised him to switch his but he has not seen any improvement with this. Platelet count was stable in July 2023.  Will repeat CBC as his symptoms seem to have appeared since most recent labs.  Otherwise, recommend follow-up with PCP/dermatology.  2. Paroxysmal atrial fibrillation: He has some episodes of intermittent  atrial fibrillation, denies bleeding on Xarelto.  Continue Xarelto.  3. Mitral valve prolapse/mitral valve regurgitation: Echo in November 2022 showed EF 60 to 65%, normal LV function, diastolic parameters, normal RV systolic function, mildly dilated left atrium, mitral valve prolapse with mild to moderate mitral valve regurgitation. Euvolemic and well compensated on exam.  Repeat echo scheduled for November/December 2023.  4. Hypothyroidism: TSH was low in September 2022.  He recently had his TSH checked by his PCP, records not available.  Continue levothyroxine.  5. Disposition: Follow-up in 3 to 4 months with Dr. Percival Spanish.     Lenna Sciara, NP 06/21/2022, 12:17 PM

## 2022-06-29 ENCOUNTER — Telehealth: Payer: Self-pay

## 2022-06-29 NOTE — Telephone Encounter (Signed)
Spoke with pt. Pt was notified of lab results and recommendations.  ?

## 2022-09-05 ENCOUNTER — Ambulatory Visit (HOSPITAL_COMMUNITY): Payer: 59 | Attending: Cardiology

## 2022-09-05 DIAGNOSIS — I4891 Unspecified atrial fibrillation: Secondary | ICD-10-CM | POA: Diagnosis present

## 2022-09-05 LAB — ECHOCARDIOGRAM COMPLETE: S' Lateral: 3.1 cm

## 2022-09-10 ENCOUNTER — Encounter: Payer: Self-pay | Admitting: *Deleted

## 2022-09-11 ENCOUNTER — Ambulatory Visit (INDEPENDENT_AMBULATORY_CARE_PROVIDER_SITE_OTHER): Payer: 59 | Admitting: Emergency Medicine

## 2022-09-11 ENCOUNTER — Encounter: Payer: Self-pay | Admitting: Emergency Medicine

## 2022-09-11 VITALS — BP 110/64 | HR 73 | Temp 97.9°F | Ht 73.0 in | Wt 183.0 lb

## 2022-09-11 DIAGNOSIS — Z1329 Encounter for screening for other suspected endocrine disorder: Secondary | ICD-10-CM | POA: Diagnosis not present

## 2022-09-11 DIAGNOSIS — Z130 Encounter for screening for diseases of the blood and blood-forming organs and certain disorders involving the immune mechanism: Secondary | ICD-10-CM | POA: Diagnosis not present

## 2022-09-11 DIAGNOSIS — Z125 Encounter for screening for malignant neoplasm of prostate: Secondary | ICD-10-CM | POA: Diagnosis not present

## 2022-09-11 DIAGNOSIS — I4891 Unspecified atrial fibrillation: Secondary | ICD-10-CM | POA: Diagnosis not present

## 2022-09-11 DIAGNOSIS — Z13228 Encounter for screening for other metabolic disorders: Secondary | ICD-10-CM

## 2022-09-11 DIAGNOSIS — E039 Hypothyroidism, unspecified: Secondary | ICD-10-CM | POA: Diagnosis not present

## 2022-09-11 DIAGNOSIS — Z0000 Encounter for general adult medical examination without abnormal findings: Secondary | ICD-10-CM | POA: Diagnosis not present

## 2022-09-11 DIAGNOSIS — Z13220 Encounter for screening for lipoid disorders: Secondary | ICD-10-CM | POA: Diagnosis not present

## 2022-09-11 DIAGNOSIS — I7121 Aneurysm of the ascending aorta, without rupture: Secondary | ICD-10-CM

## 2022-09-11 LAB — LIPID PANEL
Cholesterol: 149 mg/dL (ref 0–200)
HDL: 42.1 mg/dL (ref >39.00)
LDL Cholesterol: 94 mg/dL (ref 0–99)
NonHDL: 106.54
Total CHOL/HDL Ratio: 4
Triglycerides: 65 mg/dL (ref 0.0–149.0)
VLDL: 13 mg/dL (ref 0.0–40.0)

## 2022-09-11 LAB — CBC WITH DIFFERENTIAL/PLATELET
Basophils Absolute: 0 10*3/uL (ref 0.0–0.1)
Basophils Relative: 0.8 % (ref 0.0–3.0)
Eosinophils Absolute: 0.2 10*3/uL (ref 0.0–0.7)
Eosinophils Relative: 3.9 % (ref 0.0–5.0)
HCT: 40.5 % (ref 39.0–52.0)
Hemoglobin: 13.4 g/dL (ref 13.0–17.0)
Lymphocytes Relative: 22.8 % (ref 12.0–46.0)
Lymphs Abs: 1.1 10*3/uL (ref 0.7–4.0)
MCHC: 33 g/dL (ref 30.0–36.0)
MCV: 85.7 fl (ref 78.0–100.0)
Monocytes Absolute: 0.4 10*3/uL (ref 0.1–1.0)
Monocytes Relative: 9.2 % (ref 3.0–12.0)
Neutro Abs: 2.9 10*3/uL (ref 1.4–7.7)
Neutrophils Relative %: 63.3 % (ref 43.0–77.0)
Platelets: 217 10*3/uL (ref 150.0–400.0)
RBC: 4.73 Mil/uL (ref 4.22–5.81)
RDW: 13.3 % (ref 11.5–15.5)
WBC: 4.6 10*3/uL (ref 4.0–10.5)

## 2022-09-11 LAB — COMPREHENSIVE METABOLIC PANEL
ALT: 8 U/L (ref 0–53)
AST: 13 U/L (ref 0–37)
Albumin: 4.2 g/dL (ref 3.5–5.2)
Alkaline Phosphatase: 67 U/L (ref 39–117)
BUN: 16 mg/dL (ref 6–23)
CO2: 30 mEq/L (ref 19–32)
Calcium: 9.6 mg/dL (ref 8.4–10.5)
Chloride: 104 mEq/L (ref 96–112)
Creatinine, Ser: 1 mg/dL (ref 0.40–1.50)
GFR: 75.73 mL/min (ref >60.00)
Glucose, Bld: 88 mg/dL (ref 70–99)
Potassium: 4.5 mEq/L (ref 3.5–5.1)
Sodium: 141 mEq/L (ref 135–145)
Total Bilirubin: 0.7 mg/dL (ref 0.2–1.2)
Total Protein: 6.6 g/dL (ref 6.0–8.3)

## 2022-09-11 LAB — TSH: TSH: 2.07 u[IU]/mL (ref 0.35–5.50)

## 2022-09-11 LAB — PSA: PSA: 0.93 ng/mL (ref 0.10–4.00)

## 2022-09-11 LAB — HEMOGLOBIN A1C: Hgb A1c MFr Bld: 5.4 % (ref 4.6–6.5)

## 2022-09-11 NOTE — Assessment & Plan Note (Signed)
4.2 cm.  Needs monitoring.

## 2022-09-11 NOTE — Assessment & Plan Note (Signed)
Persistent atrial fibrillation on long-term anticoagulation with Xarelto 20 mg daily.  No clinical bleeding concerns. Fall precautions given. Normotensive with well-controlled rate. Unremarkable echocardiogram.

## 2022-09-11 NOTE — Progress Notes (Signed)
Ethan Lee 71 y.o.   Chief Complaint  Patient presents with   Annual Exam    HISTORY OF PRESENT ILLNESS: This is a 71 y.o. male A1A here for annual exam. History of persistent atrial fibrillation, on Xarelto, Has been evaluated by cardiologist.  Echocardiogram done last week.  Report reviewed with patient. Has no complaints or medical concerns Had left knee replacement surgery 2 months ago.  Doing well. Overall doing well.  HPI   Prior to Admission medications   Medication Sig Start Date End Date Taking? Authorizing Provider  cetirizine (ZYRTEC) 10 MG tablet Take 10 mg by mouth at bedtime.   Yes [provider]  Cetirizine HCl (ZYRTEC ALLERGY) 10 MG CAPS at bedtime.   Yes [provider]  cholecalciferol (VITAMIN D) 1000 UNITS tablet Take 1,000 Units by mouth daily.   Yes [provider]  EPINEPHrine 0.3 mg/0.3 mL IJ SOAJ injection Inject 0.3 mg into the muscle as needed for anaphylaxis. 04/20/21  Yes [provider]  levothyroxine (SYNTHROID) 50 MCG tablet TAKE 1 TABLET BY MOUTH  DAILY BEFORE BREAKFAST 09/07/21  Yes Deverick Pruss, Ines Bloomer, MD  vitamin B-12 (CYANOCOBALAMIN) 100 MCG tablet Take 100 mcg by mouth daily.   Yes [provider]  vitamin C (ASCORBIC ACID) 500 MG tablet Take 500 mg by mouth daily. Two tabs   Yes [provider]  vitamin E 400 UNIT capsule Take 400 Units by mouth daily.   Yes [provider]  XARELTO 20 MG TABS tablet TAKE 1 TABLET BY MOUTH DAILY  WITH SUPPER 02/20/22  Yes Minus Breeding, MD    Allergies  Allergen Reactions   Shellfish Allergy Hives    Patient Active Problem List   Diagnosis Date Noted   Atrial fibrillation ([place]) 08/14/2021   Aneurysm of ascending aorta without rupture ([place]) 08/02/2021   Palpitations 08/02/2021   Hypothyroidism 03/15/2016   Abnormal heart sounds 07/15/2012   Allergy history, peanuts 06/30/2012    Past Medical History:  Diagnosis Date   Allergy     Hypothyroid     Past Surgical History:  Procedure Laterality Date   CARDIOVERSION N/A 10/24/2021   Procedure: CARDIOVERSION;  Surgeon: Berniece Salines, DO;  Location: [facility];  Service: Cardiovascular;  Laterality: N/A;   KNEE SURGERY     x 4   TONSILLECTOMY AND ADENOIDECTOMY     Vocal cord polyps      Social History   Socioeconomic History   Marital status: Married    Spouse name: Not on file   Number of children: 2   Years of education: Not on file   Highest education level: Not on file  Occupational History    Employer: [employer]  Tobacco Use   Smoking status: Never   Smokeless tobacco: Never  Substance and Sexual Activity   Alcohol use: Yes    Alcohol/week: 3.0 standard drinks of alcohol    Types: 3 Glasses of wine per week   Drug use: No   Sexual activity: Yes  Other Topics Concern   Not on file  Social History Narrative   Lives with wife and works at [employer]  granddaughters.       Social Determinants of Health   Financial Resource Strain: Not on file  Food Insecurity: Not on file  Transportation Needs: Not on file  Physical Activity: Not on file  Stress: Not on file  Social Connections: Not on file  Intimate Partner Violence: Not on file    Family  History  Problem Relation Age of Onset   Cancer Mother        COLON CANCER   Leukemia Father    Other Father        blood disorder   Cancer Brother        testicular cancer   Cancer Brother        liver/kidney cancer     Review of Systems  Constitutional: Negative.  Negative for chills and fever.  HENT: Negative.  Negative for congestion and sore throat.   Respiratory: Negative.  Negative for cough and shortness of breath.   Cardiovascular: Negative.  Negative for chest pain and palpitations.  Gastrointestinal: Negative.  Negative for abdominal pain, diarrhea, nausea and vomiting.  Genitourinary: Negative.  Negative for dysuria and urgency.  Skin: Negative.  Negative for rash.   Neurological: Negative.  Negative for dizziness and headaches.  All other systems reviewed and are negative.  Today's Vitals   09/11/22 0849  BP: 110/64  Pulse: 73  Temp: 97.9 F (36.6 C)  TempSrc: Oral  SpO2: 99%  Weight: 183 lb (83 kg)  Height: '6\' 1"'$  (1.854 m)   Body mass index is 24.14 kg/m.   Physical Exam Vitals reviewed.  Constitutional:      Appearance: Normal appearance.  HENT:     Head: Normocephalic.     Right Ear: Tympanic membrane, ear canal and external ear normal.     Left Ear: Tympanic membrane, ear canal and external ear normal.     Mouth/Throat:     Mouth: Mucous membranes are moist.     Pharynx: Oropharynx is clear.  Eyes:     Extraocular Movements: Extraocular movements intact.     Conjunctiva/sclera: Conjunctivae normal.     Pupils: Pupils are equal, round, and reactive to light.  Cardiovascular:     Rate and Rhythm: Normal rate. Rhythm irregular.     Pulses: Normal pulses.     Heart sounds: Normal heart sounds.  Pulmonary:     Effort: Pulmonary effort is normal.     Breath sounds: Normal breath sounds.  Abdominal:     General: There is no distension.     Palpations: Abdomen is soft.     Tenderness: There is no abdominal tenderness.  Musculoskeletal:     Cervical back: No tenderness.     Comments: Left knee: Residual postop swelling  Lymphadenopathy:     Cervical: No cervical adenopathy.  Skin:    General: Skin is warm and dry.  Neurological:     General: No focal deficit present.     Mental Status: He is alert and oriented to person, place, and time.  Psychiatric:        Mood and Affect: Mood normal.        Behavior: Behavior normal.      ASSESSMENT & PLAN: Problem List Items Addressed This Visit       Cardiovascular and Mediastinum   Aneurysm of ascending aorta without rupture (HCC)    4.2 cm.  Needs monitoring.      Atrial fibrillation (HCC)    Persistent atrial fibrillation on long-term anticoagulation with Xarelto 20 mg  daily.  No clinical bleeding concerns. Fall precautions given. Normotensive with well-controlled rate. Unremarkable echocardiogram.      Relevant Orders   Comprehensive metabolic panel     Endocrine   Hypothyroidism    Clinically euthyroid.  TSH done today. Continue Synthroid 50 mcg daily.      Relevant Orders   TSH  Other Visit Diagnoses     Routine general medical examination at a health care facility    -  Primary   Relevant Orders   CBC with Differential   Comprehensive metabolic panel   Hemoglobin A1c   Lipid panel   Prostate cancer screening       Relevant Orders   PSA(Must document that pt has been informed of limitations of PSA testing.)   Screening for deficiency anemia       Relevant Orders   CBC with Differential   Screening for lipoid disorders       Relevant Orders   Lipid panel   Screening for endocrine, metabolic and immunity disorder       Relevant Orders   Comprehensive metabolic panel   Hemoglobin A1c      Modifiable risk factors discussed with patient. Anticipatory guidance according to age provided. The following topics were also discussed: Social Determinants of Health Smoking.  Non-smoker Diet and nutrition.  Good eating habits Benefits of exercise Cancer screening and review of most recent colonoscopy report done last May Vaccinations reviewed and recommendations Cardiovascular risk assessment The 10-year ASCVD risk score (Arnett DK, et al., 2019) is: 14.6%   Values used to calculate the score:     Age: 89 years     Sex: Male     Is Non-Hispanic African American: No     Diabetic: No     Tobacco smoker: No     Systolic Blood Pressure: 854 mmHg     Is BP treated: No     HDL Cholesterol: 41 mg/dL     Total Cholesterol: 155 mg/dL Review of chronic medical problems and all medications Mental health including depression and anxiety Fall and accident prevention  Patient Instructions  Health Maintenance, Male Adopting a healthy  lifestyle and getting preventive care are important in promoting health and wellness. Ask your health care provider about: The right schedule for you to have regular tests and exams. Things you can do on your own to prevent diseases and keep yourself healthy. What should I know about diet, weight, and exercise? Eat a healthy diet  Eat a diet that includes plenty of vegetables, fruits, low-fat dairy products, and lean protein. Do not eat a lot of foods that are high in solid fats, added sugars, or sodium. Maintain a healthy weight Body mass index (BMI) is a measurement that can be used to identify possible weight problems. It estimates body fat based on height and weight. Your health care provider can help determine your BMI and help you achieve or maintain a healthy weight. Get regular exercise Get regular exercise. This is one of the most important things you can do for your health. Most adults should: Exercise for at least 150 minutes each week. The exercise should increase your heart rate and make you sweat (moderate-intensity exercise). Do strengthening exercises at least twice a week. This is in addition to the moderate-intensity exercise. Spend less time sitting. Even light physical activity can be beneficial. Watch cholesterol and blood lipids Have your blood tested for lipids and cholesterol at 71 years of age, then have this test every 5 years. You may need to have your cholesterol levels checked more often if: Your lipid or cholesterol levels are high. You are older than 71 years of age. You are at high risk for heart disease. What should I know about cancer screening? Many types of cancers can be detected early and may often be prevented. Depending on your  health history and family history, you may need to have cancer screening at various ages. This may include screening for: Colorectal cancer. Prostate cancer. Skin cancer. Lung cancer. What should I know about heart disease,  diabetes, and high blood pressure? Blood pressure and heart disease High blood pressure causes heart disease and increases the risk of stroke. This is more likely to develop in people who have high blood pressure readings or are overweight. Talk with your health care provider about your target blood pressure readings. Have your blood pressure checked: Every 3-5 years if you are 64-67 years of age. Every year if you are 46 years old or older. If you are between the ages of 39 and 79 and are a current or former smoker, ask your health care provider if you should have a one-time screening for abdominal aortic aneurysm (AAA). Diabetes Have regular diabetes screenings. This checks your fasting blood sugar level. Have the screening done: Once every three years after age 35 if you are at a normal weight and have a low risk for diabetes. More often and at a younger age if you are overweight or have a high risk for diabetes. What should I know about preventing infection? Hepatitis B If you have a higher risk for hepatitis B, you should be screened for this virus. Talk with your health care provider to find out if you are at risk for hepatitis B infection. Hepatitis C Blood testing is recommended for: Everyone born from [birth year] through 1965. Anyone with known risk factors for hepatitis C. Sexually transmitted infections (STIs) You should be screened each year for STIs, including gonorrhea and chlamydia, if: You are sexually active and are younger than 71 years of age. You are older than 71 years of age and your health care provider tells you that you are at risk for this type of infection. Your sexual activity has changed since you were last screened, and you are at increased risk for chlamydia or gonorrhea. Ask your health care provider if you are at risk. Ask your health care provider about whether you are at high risk for HIV. Your health care provider may recommend a prescription medicine to help  prevent HIV infection. If you choose to take medicine to prevent HIV, you should first get tested for HIV. You should then be tested every 3 months for as long as you are taking the medicine. Follow these instructions at home: Alcohol use Do not drink alcohol if your health care provider tells you not to drink. If you drink alcohol: Limit how much you have to 0-2 drinks a day. Know how much alcohol is in your drink. In the [place] one drink equals one 12 oz bottle of beer (355 mL), one 5 oz glass of wine (148 mL), or one 1 oz glass of hard liquor (44 mL). Lifestyle Do not use any products that contain nicotine or tobacco. These products include cigarettes, chewing tobacco, and vaping devices, such as e-cigarettes. If you need help quitting, ask your health care provider. Do not use street drugs. Do not share needles. Ask your health care provider for help if you need support or information about quitting drugs. General instructions Schedule regular health, dental, and eye exams. Stay current with your vaccines. Tell your health care provider if: You often feel depressed. You have ever been abused or do not feel safe at home. Summary Adopting a healthy lifestyle and getting preventive care are important in promoting health and wellness. Follow your health care  provider's instructions about healthy diet, exercising, and getting tested or screened for diseases. Follow your health care provider's instructions on monitoring your cholesterol and blood pressure. This information is not intended to replace advice given to you by your health care provider. Make sure you discuss any questions you have with your health care provider. Document Revised: 02/06/2021 Document Reviewed: 02/06/2021 Elsevier Patient Education  [place], MD [facility]

## 2022-09-11 NOTE — Patient Instructions (Signed)
Health Maintenance, Male Adopting a healthy lifestyle and getting preventive care are important in promoting health and wellness. Ask your health care provider about: The right schedule for you to have regular tests and exams. Things you can do on your own to prevent diseases and keep yourself healthy. What should I know about diet, weight, and exercise? Eat a healthy diet  Eat a diet that includes plenty of vegetables, fruits, low-fat dairy products, and lean protein. Do not eat a lot of foods that are high in solid fats, added sugars, or sodium. Maintain a healthy weight Body mass index (BMI) is a measurement that can be used to identify possible weight problems. It estimates body fat based on height and weight. Your health care provider can help determine your BMI and help you achieve or maintain a healthy weight. Get regular exercise Get regular exercise. This is one of the most important things you can do for your health. Most adults should: Exercise for at least 150 minutes each week. The exercise should increase your heart rate and make you sweat (moderate-intensity exercise). Do strengthening exercises at least twice a week. This is in addition to the moderate-intensity exercise. Spend less time sitting. Even light physical activity can be beneficial. Watch cholesterol and blood lipids Have your blood tested for lipids and cholesterol at 71 years of age, then have this test every 5 years. You may need to have your cholesterol levels checked more often if: Your lipid or cholesterol levels are high. You are older than 71 years of age. You are at high risk for heart disease. What should I know about cancer screening? Many types of cancers can be detected early and may often be prevented. Depending on your health history and family history, you may need to have cancer screening at various ages. This may include screening for: Colorectal cancer. Prostate cancer. Skin cancer. Lung  cancer. What should I know about heart disease, diabetes, and high blood pressure? Blood pressure and heart disease High blood pressure causes heart disease and increases the risk of stroke. This is more likely to develop in people who have high blood pressure readings or are overweight. Talk with your health care provider about your target blood pressure readings. Have your blood pressure checked: Every 3-5 years if you are 18-39 years of age. Every year if you are 40 years old or older. If you are between the ages of 65 and 75 and are a current or former smoker, ask your health care provider if you should have a one-time screening for abdominal aortic aneurysm (AAA). Diabetes Have regular diabetes screenings. This checks your fasting blood sugar level. Have the screening done: Once every three years after age 45 if you are at a normal weight and have a low risk for diabetes. More often and at a younger age if you are overweight or have a high risk for diabetes. What should I know about preventing infection? Hepatitis B If you have a higher risk for hepatitis B, you should be screened for this virus. Talk with your health care provider to find out if you are at risk for hepatitis B infection. Hepatitis C Blood testing is recommended for: Everyone born from 1945 through 1965. Anyone with known risk factors for hepatitis C. Sexually transmitted infections (STIs) You should be screened each year for STIs, including gonorrhea and chlamydia, if: You are sexually active and are younger than 71 years of age. You are older than 71 years of age and your   health care provider tells you that you are at risk for this type of infection. Your sexual activity has changed since you were last screened, and you are at increased risk for chlamydia or gonorrhea. Ask your health care provider if you are at risk. Ask your health care provider about whether you are at high risk for HIV. Your health care provider  may recommend a prescription medicine to help prevent HIV infection. If you choose to take medicine to prevent HIV, you should first get tested for HIV. You should then be tested every 3 months for as long as you are taking the medicine. Follow these instructions at home: Alcohol use Do not drink alcohol if your health care provider tells you not to drink. If you drink alcohol: Limit how much you have to 0-2 drinks a day. Know how much alcohol is in your drink. In the [place] one drink equals one 12 oz bottle of beer (355 mL), one 5 oz glass of wine (148 mL), or one 1 oz glass of hard liquor (44 mL). Lifestyle Do not use any products that contain nicotine or tobacco. These products include cigarettes, chewing tobacco, and vaping devices, such as e-cigarettes. If you need help quitting, ask your health care provider. Do not use street drugs. Do not share needles. Ask your health care provider for help if you need support or information about quitting drugs. General instructions Schedule regular health, dental, and eye exams. Stay current with your vaccines. Tell your health care provider if: You often feel depressed. You have ever been abused or do not feel safe at home. Summary Adopting a healthy lifestyle and getting preventive care are important in promoting health and wellness. Follow your health care provider's instructions about healthy diet, exercising, and getting tested or screened for diseases. Follow your health care provider's instructions on monitoring your cholesterol and blood pressure. This information is not intended to replace advice given to you by your health care provider. Make sure you discuss any questions you have with your health care provider. Document Revised: 02/06/2021 Document Reviewed: 02/06/2021 Elsevier Patient Education  2023 [employer]vier Inc.  

## 2022-09-11 NOTE — Assessment & Plan Note (Signed)
Clinically euthyroid.  TSH done today. Continue Synthroid 50 mcg daily. 

## 2022-09-16 ENCOUNTER — Other Ambulatory Visit: Payer: Self-pay | Admitting: Emergency Medicine

## 2022-09-16 DIAGNOSIS — E038 Other specified hypothyroidism: Secondary | ICD-10-CM

## 2022-09-18 DIAGNOSIS — I341 Nonrheumatic mitral (valve) prolapse: Secondary | ICD-10-CM | POA: Insufficient documentation

## 2022-09-18 NOTE — Progress Notes (Unsigned)
Cardiology Office Note   Date:  09/20/2022   ID:  Ethan Lee, DOB [birth year], MRN [id number]  PCP:  Horald Pollen, MD  Cardiologist:   Minus Breeding, MD  Referring:  Horald Pollen, *   Chief Complaint  Patient presents with   Atrial Fibrillation      History of Present Illness: Ethan Lee is a 71 y.o. male who presents for follow up of atrial fib.   He had cardioversion in January.    He went back into atrial fib.   Echo this month did not demonstrate any MVP and only mild MR.    He had his knee replaced on the left since I last saw him. The patient denies any new symptoms such as chest discomfort, neck or arm discomfort. There has been no new shortness of breath, PND or orthopnea. There have been no reported palpitations, presyncope or syncope.  He does not really notice his atrial fibrillation at all.   Past Medical History:  Diagnosis Date   Allergy    Hypothyroid     Past Surgical History:  Procedure Laterality Date   CARDIOVERSION N/A 10/24/2021   Procedure: CARDIOVERSION;  Surgeon: Berniece Salines, DO;  Location: [facility];  Service: Cardiovascular;  Laterality: N/A;   KNEE SURGERY     x 4   TONSILLECTOMY AND ADENOIDECTOMY     Vocal cord polyps       Current Outpatient Medications  Medication Sig Dispense Refill   cetirizine (ZYRTEC) 10 MG tablet Take 10 mg by mouth at bedtime.     Cetirizine HCl (ZYRTEC ALLERGY) 10 MG CAPS at bedtime.     cholecalciferol (VITAMIN D) 1000 UNITS tablet Take 1,000 Units by mouth daily.     EPINEPHrine 0.3 mg/0.3 mL IJ SOAJ injection Inject 0.3 mg into the muscle as needed for anaphylaxis.     levothyroxine (SYNTHROID) 50 MCG tablet TAKE 1 TABLET BY MOUTH DAILY  BEFORE BREAKFAST 90 tablet 3   vitamin B-12 (CYANOCOBALAMIN) 100 MCG tablet Take 100 mcg by mouth daily.     vitamin C (ASCORBIC ACID) 500 MG tablet Take 500 mg by mouth daily. Two tabs     vitamin E 400 UNIT capsule Take 400 Units by  mouth daily.     rivaroxaban (XARELTO) 20 MG TABS tablet Take 1 tablet (20 mg total) by mouth daily with supper. 90 tablet 3   No current facility-administered medications for this visit.    Allergies:   Shellfish allergy    ROS:  Please see the history of present illness.   Otherwise, review of systems are positive for none.   All other systems are reviewed and negative.    PHYSICAL EXAM: VS:  BP 108/64   Pulse 64   Ht '6\' 1"'$  (1.854 m)   Wt 185 lb 3.2 oz (84 kg)   SpO2 91%   BMI 24.43 kg/m  , BMI Body mass index is 24.43 kg/m. GENERAL:  Well appearing NECK:  No jugular venous distention, waveform within normal limits, carotid upstroke brisk and symmetric, no bruits, no thyromegaly LUNGS:  Clear to auscultation bilaterally CHEST:  Unremarkable HEART:  PMI not displaced or sustained,S1 and S2 within normal limits, no S3, no clicks, no rubs, no murmurs, irregular ABD:  Flat, positive bowel sounds normal in frequency in pitch, no bruits, no rebound, no guarding, no midline pulsatile mass, no hepatomegaly, no splenomegaly EXT:  2 plus pulses throughout, no edema, no cyanosis no clubbing   EKG:  EKG is  ordered today. The ekg ordered  demonstrates atrial fibrillation, rate 64, left axis deviation, poor anterior R wave progression, borderline first-degree AV block, no acute ST-T wave changes.  There is no change compared to previous.    Recent Labs: 09/11/2022: ALT 8; BUN 16; Creatinine, Ser 1.00; Hemoglobin 13.4; Platelets 217.0; Potassium 4.5; Sodium 141; TSH 2.07    Lipid Panel    Component Value Date/Time   CHOL 149 09/11/2022 0930   CHOL 155 06/23/2021 0716   TRIG 65.0 09/11/2022 0930   HDL 42.10 09/11/2022 0930   HDL 41 06/23/2021 0716   CHOLHDL 4 09/11/2022 0930   VLDL 13.0 09/11/2022 0930   LDLCALC 94 09/11/2022 0930   LDLCALC 101 (H) 06/23/2021 0716      Wt Readings from Last 3 Encounters:  09/20/22 185 lb 3.2 oz (84 kg)  09/11/22 183 lb (83 kg)  06/21/22 193  lb 6.4 oz (87.7 kg)      Other studies Reviewed: Additional studies/ records that were reviewed today include: echo, labs Review of the above records demonstrates:  Please see elsewhere in the note.     ASSESSMENT AND PLAN:   ATRIAL FIB:     We had long conversations about this.  He wants to continue his anticoagulation though his CHA2DS2-VASc score is technically low.  He has not wanted consider ablation given the fact that he is not having any symptoms.  No change in therapy.   MVP:   There was no evidence of prolapse and only mild MR. No further imaging  Enlarged aorta: This was noted on echo.  I will follow this up with a CT of his chest in 1 year.  Current medicines are reviewed at length with the patient today.  The patient does not have concerns regarding medicines.  The following changes have been made: None  Labs/ tests ordered today include:     Orders Placed This Encounter  Procedures   CT ANGIO CHEST AORTA W/CM & OR WO/CM   Basic metabolic panel   EKG 57-WIOM      Disposition:   Follow-up with me 1 year  Signed, Minus Breeding, MD  09/20/2022 9:20 AM    [facility]

## 2022-09-20 ENCOUNTER — Ambulatory Visit: Payer: 59 | Attending: Cardiology | Admitting: Cardiology

## 2022-09-20 ENCOUNTER — Encounter: Payer: Self-pay | Admitting: Cardiology

## 2022-09-20 ENCOUNTER — Other Ambulatory Visit: Payer: Self-pay

## 2022-09-20 VITALS — BP 108/64 | HR 64 | Ht 73.0 in | Wt 185.2 lb

## 2022-09-20 DIAGNOSIS — I4891 Unspecified atrial fibrillation: Secondary | ICD-10-CM

## 2022-09-20 DIAGNOSIS — I341 Nonrheumatic mitral (valve) prolapse: Secondary | ICD-10-CM

## 2022-09-20 DIAGNOSIS — I7121 Aneurysm of the ascending aorta, without rupture: Secondary | ICD-10-CM | POA: Diagnosis not present

## 2022-09-20 MED ORDER — RIVAROXABAN 20 MG PO TABS: 20.0000 mg | ORAL_TABLET | Freq: Every day | ORAL | 3 refills | Status: DC

## 2022-09-20 NOTE — Patient Instructions (Signed)
Medication Instructions:  Continue same medications *If you need a refill on your cardiac medications before your next appointment, please call your pharmacy*   Lab Work: Have bmet 1 week before Chest CT Lab order enclosed   Testing/Procedures: Schedule Chest CT in 1 year 09/2023   Follow-Up: At [facility], you and your health needs are our priority.  As part of our continuing mission to provide you with exceptional heart care, we have created designated Provider Care Teams.  These Care Teams include your primary Cardiologist (physician) and Advanced Practice Providers (APPs -  Physician Assistants and Nurse Practitioners) who all work together to provide you with the care you need, when you need it.  We recommend signing up for the patient portal called "MyChart".  Sign up information is provided on this After Visit Summary.  MyChart is used to connect with patients for Virtual Visits (Telemedicine).  Patients are able to view lab/test results, encounter notes, upcoming appointments, etc.  Non-urgent messages can be sent to your provider as well.   To learn more about what you can do with MyChart, go to [email or web address].    Your next appointment: 1 year   Call in Sept to schedule Dec appointment      The format for your next appointment: Office   Provider:  Dr.Hochrein   Important Information About Sugar

## 2023-03-05 ENCOUNTER — Other Ambulatory Visit: Payer: Self-pay | Admitting: Cardiology

## 2023-03-05 NOTE — Telephone Encounter (Signed)
Prescription refill request for Xarelto received.  Indication: afib  Last office visit: 09/20/2022, Hochrein Weight: 84 kg  Age: 72 yo  Scr: 1.00, 09/11/2022 CrCl: 68 ml/min   Refill sent.

## 2023-03-13 ENCOUNTER — Ambulatory Visit (INDEPENDENT_AMBULATORY_CARE_PROVIDER_SITE_OTHER): Payer: 59 | Admitting: Emergency Medicine

## 2023-03-13 ENCOUNTER — Encounter: Payer: Self-pay | Admitting: Emergency Medicine

## 2023-03-13 VITALS — BP 118/80 | HR 50 | Temp 97.8°F | Ht 73.0 in | Wt 188.1 lb

## 2023-03-13 DIAGNOSIS — E039 Hypothyroidism, unspecified: Secondary | ICD-10-CM | POA: Diagnosis not present

## 2023-03-13 DIAGNOSIS — I4811 Longstanding persistent atrial fibrillation: Secondary | ICD-10-CM | POA: Diagnosis not present

## 2023-03-13 DIAGNOSIS — I7121 Aneurysm of the ascending aorta, without rupture: Secondary | ICD-10-CM | POA: Diagnosis not present

## 2023-03-13 NOTE — Patient Instructions (Signed)
Health Maintenance After Age 72 After age 72, you are at a higher risk for certain long-term diseases and infections as well as injuries from falls. Falls are a major cause of broken bones and head injuries in people who are older than age 72. Getting regular preventive care can help to keep you healthy and well. Preventive care includes getting regular testing and making lifestyle changes as recommended by your health care provider. Talk with your health care provider about: Which screenings and tests you should have. A screening is a test that checks for a disease when you have no symptoms. A diet and exercise plan that is right for you. What should I know about screenings and tests to prevent falls? Screening and testing are the best ways to find a health problem early. Early diagnosis and treatment give you the best chance of managing medical conditions that are common after age 72. Certain conditions and lifestyle choices may make you more likely to have a fall. Your health care provider may recommend: Regular vision checks. Poor vision and conditions such as cataracts can make you more likely to have a fall. If you wear glasses, make sure to get your prescription updated if your vision changes. Medicine review. Work with your health care provider to regularly review all of the medicines you are taking, including over-the-counter medicines. Ask your health care provider about any side effects that may make you more likely to have a fall. Tell your health care provider if any medicines that you take make you feel dizzy or sleepy. Strength and balance checks. Your health care provider may recommend certain tests to check your strength and balance while standing, walking, or changing positions. Foot health exam. Foot pain and numbness, as well as not wearing proper footwear, can make you more likely to have a fall. Screenings, including: Osteoporosis screening. Osteoporosis is a condition that causes  the bones to get weaker and break more easily. Blood pressure screening. Blood pressure changes and medicines to control blood pressure can make you feel dizzy. Depression screening. You may be more likely to have a fall if you have a fear of falling, feel depressed, or feel unable to do activities that you used to do. Alcohol use screening. Using too much alcohol can affect your balance and may make you more likely to have a fall. Follow these instructions at home: Lifestyle Do not drink alcohol if: Your health care provider tells you not to drink. If you drink alcohol: Limit how much you have to: 0-1 drink a day for women. 0-2 drinks a day for men. Know how much alcohol is in your drink. In the [place] one drink equals one 12 oz bottle of beer (355 mL), one 5 oz glass of wine (148 mL), or one 1 oz glass of hard liquor (44 mL). Do not use any products that contain nicotine or tobacco. These products include cigarettes, chewing tobacco, and vaping devices, such as e-cigarettes. If you need help quitting, ask your health care provider. Activity  Follow a regular exercise program to stay fit. This will help you maintain your balance. Ask your health care provider what types of exercise are appropriate for you. If you need a cane or walker, use it as recommended by your health care provider. Wear supportive shoes that have nonskid soles. Safety  Remove any tripping hazards, such as rugs, cords, and clutter. Install safety equipment such as grab bars in bathrooms and safety rails on stairs. Keep rooms and walkways   well-lit. General instructions Talk with your health care provider about your risks for falling. Tell your health care provider if: You fall. Be sure to tell your health care provider about all falls, even ones that seem minor. You feel dizzy, tiredness (fatigue), or off-balance. Take over-the-counter and prescription medicines only as told by your health care provider. These include  supplements. Eat a healthy diet and maintain a healthy weight. A healthy diet includes low-fat dairy products, low-fat (lean) meats, and fiber from whole grains, beans, and lots of fruits and vegetables. Stay current with your vaccines. Schedule regular health, dental, and eye exams. Summary Having a healthy lifestyle and getting preventive care can help to protect your health and wellness after age 72. Screening and testing are the best way to find a health problem early and help you avoid having a fall. Early diagnosis and treatment give you the best chance for managing medical conditions that are more common for people who are older than age 72. Falls are a major cause of broken bones and head injuries in people who are older than age 72. Take precautions to prevent a fall at home. Work with your health care provider to learn what changes you can make to improve your health and wellness and to prevent falls. This information is not intended to replace advice given to you by your health care provider. Make sure you discuss any questions you have with your health care provider. Document Revised: 02/06/2021 Document Reviewed: 02/06/2021 Elsevier Patient Education  2024 [employer] Inc.  

## 2023-03-13 NOTE — Assessment & Plan Note (Signed)
Long-term persistent.  Well-controlled ventricular rate Continues Xarelto 20 mg daily.  No bleeding episodes or complications Fall precautions given

## 2023-03-13 NOTE — Progress Notes (Signed)
Ethan Lee 72 y.o.   Chief Complaint  Patient presents with   Medical Management of Chronic Issues    f/u appt, no concerns     HISTORY OF PRESENT ILLNESS: This is a 72 y.o. male here for 47-month follow-up of chronic medical conditions including persistent atrial fibrillation On Xarelto 20 mg daily History of hypothyroidism on Synthroid 50 mcg daily Overall doing well.  Has no complaints or any other medical concerns today. Most recent cardiologist visit assessment and plan as follows: ASSESSMENT AND PLAN:     ATRIAL FIB:     We had long conversations about this.  He wants to continue his anticoagulation though his CHA2DS2-VASc score is technically low.  He has not wanted consider ablation given the fact that he is not having any symptoms.  No change in therapy.    MVP:   There was no evidence of prolapse and only mild MR. No further imaging   Enlarged aorta: This was noted on echo.  I will follow this up with a CT of his chest in 1 year.   Current medicines are reviewed at length with the patient today.  The patient does not have concerns regarding medicines.   The following changes have been made: None   Labs/ tests ordered today include:         Orders Placed This Encounter  Procedures   CT ANGIO CHEST AORTA W/CM & OR WO/CM   Basic metabolic panel   EKG 12-Lead    HPI   Prior to Admission medications   Medication Sig Start Date End Date Taking? Authorizing Provider  Cetirizine HCl (ZYRTEC ALLERGY) 10 MG CAPS at bedtime.   Yes [provider]  cholecalciferol (VITAMIN D) 1000 UNITS tablet Take 1,000 Units by mouth daily.   Yes [provider]  EPINEPHrine 0.3 mg/0.3 mL IJ SOAJ injection Inject 0.3 mg into the muscle as needed for anaphylaxis. 04/20/21  Yes [provider]  levothyroxine (SYNTHROID) 50 MCG tablet TAKE 1 TABLET BY MOUTH DAILY  BEFORE BREAKFAST 09/17/22  Yes Mariangel Ringley, Eilleen Kempf, MD  rivaroxaban (XARELTO) 20 MG  TABS tablet TAKE 1 TABLET BY MOUTH DAILY  WITH SUPPER 03/05/23  Yes Hochrein, Fayrene Fearing, MD  vitamin B-12 (CYANOCOBALAMIN) 100 MCG tablet Take 100 mcg by mouth daily.   Yes [provider]  vitamin C (ASCORBIC ACID) 500 MG tablet Take 500 mg by mouth daily. Two tabs   Yes [provider]  vitamin E 400 UNIT capsule Take 400 Units by mouth daily.   Yes [provider]  cetirizine (ZYRTEC) 10 MG tablet Take 10 mg by mouth at bedtime.    [provider]    Allergies  Allergen Reactions   Shellfish Allergy Hives    Patient Active Problem List   Diagnosis Date Noted   Mitral valve prolapse 09/18/2022   Atrial fibrillation (HCC) 08/14/2021   Aneurysm of ascending aorta without rupture (HCC) 08/02/2021   Palpitations 08/02/2021   Hypothyroidism 03/15/2016   Abnormal heart sounds 07/15/2012   Allergy history, peanuts 06/30/2012    Past Medical History:  Diagnosis Date   Allergy    Hypothyroid     Past Surgical History:  Procedure Laterality Date   CARDIOVERSION N/A 10/24/2021   Procedure: CARDIOVERSION;  Surgeon: Thomasene Ripple, DO;  Location: [facility];  Service: Cardiovascular;  Laterality: N/A;   KNEE SURGERY     x 4   TONSILLECTOMY AND ADENOIDECTOMY     Vocal cord polyps  Social History   Socioeconomic History   Marital status: Married    Spouse name: Not on file   Number of children: 2   Years of education: Not on file   Highest education level: Not on file  Occupational History    Employer: [employer]  Tobacco Use   Smoking status: Never   Smokeless tobacco: Never  Substance and Sexual Activity   Alcohol use: Yes    Alcohol/week: 3.0 standard drinks of alcohol    Types: 3 Glasses of wine per week   Drug use: No   Sexual activity: Yes  Other Topics Concern   Not on file  Social History Narrative   Lives with wife and works at [employer].  Tsp  granddaughters.       Social Determinants of Health   Financial Resource Strain:  Not on file  Food Insecurity: Not on file  Transportation Needs: Not on file  Physical Activity: Not on file  Stress: Not on file  Social Connections: Not on file  Intimate Partner Violence: Not on file    Family History  Problem Relation Age of Onset   Cancer Mother        COLON CANCER   Leukemia Father    Other Father        blood disorder   Cancer Brother        testicular cancer   Cancer Brother        liver/kidney cancer     Review of Systems  Constitutional: Negative.  Negative for chills and fever.  HENT: Negative.  Negative for congestion and sore throat.   Respiratory: Negative.  Negative for cough and shortness of breath.   Cardiovascular: Negative.  Negative for chest pain and palpitations.  Gastrointestinal:  Negative for abdominal pain, diarrhea, nausea and vomiting.  Genitourinary: Negative.  Negative for dysuria and hematuria.  Skin: Negative.  Negative for rash.  Neurological: Negative.  Negative for dizziness and headaches.  All other systems reviewed and are negative.   Vitals:   03/13/23 0850  BP: 118/80  Pulse: (!) 50  Temp: 97.8 F (36.6 C)  SpO2: 97%    Physical Exam Vitals reviewed.  Constitutional:      Appearance: Normal appearance.  HENT:     Head: Normocephalic.     Mouth/Throat:     Mouth: Mucous membranes are moist.     Pharynx: Oropharynx is clear.  Eyes:     Extraocular Movements: Extraocular movements intact.     Pupils: Pupils are equal, round, and reactive to light.  Cardiovascular:     Rate and Rhythm: Normal rate. Rhythm irregular.     Pulses: Normal pulses.     Heart sounds: Normal heart sounds.  Pulmonary:     Effort: Pulmonary effort is normal.     Breath sounds: Normal breath sounds.  Skin:    General: Skin is warm and dry.  Neurological:     Mental Status: He is alert and oriented to person, place, and time.  Psychiatric:        Mood and Affect: Mood normal.        Behavior: Behavior normal.       ASSESSMENT & PLAN: A total of 42 minutes was spent with the patient and counseling/coordination of care regarding preparing for this visit, review of most recent office visit notes, review of most recent cardiologist office visit notes, review of chronic medical conditions under management, review of all medications, review of most recent blood work  results, prognosis, documentation and need for follow-up  Problem List Items Addressed This Visit       Cardiovascular and Mediastinum   Aneurysm of ascending aorta without rupture (HCC)    Stable.  Under surveillance. Scheduled for CT angio next December      Atrial fibrillation [facility]) - Primary    Long-term persistent.  Well-controlled ventricular rate Continues Xarelto 20 mg daily.  No bleeding episodes or complications Fall precautions given      Relevant Orders   TSH   CBC with Differential/Platelet   Comprehensive metabolic panel   Hemoglobin A1c   Lipid panel     Endocrine   Hypothyroidism    Clinically euthyroid. TSH done today. Continue Synthroid 50 mcg daily.      Relevant Orders   TSH   CBC with Differential/Platelet   Comprehensive metabolic panel   Hemoglobin A1c   Lipid panel   Patient Instructions  Health Maintenance After Age 56 After age 30, you are at a higher risk for certain long-term diseases and infections as well as injuries from falls. Falls are a major cause of broken bones and head injuries in people who are older than age 73. Getting regular preventive care can help to keep you healthy and well. Preventive care includes getting regular testing and making lifestyle changes as recommended by your health care provider. Talk with your health care provider about: Which screenings and tests you should have. A screening is a test that checks for a disease when you have no symptoms. A diet and exercise plan that is right for you. What should I know about screenings and tests to prevent  falls? Screening and testing are the best ways to find a health problem early. Early diagnosis and treatment give you the best chance of managing medical conditions that are common after age 45. Certain conditions and lifestyle choices may make you more likely to have a fall. Your health care provider may recommend: Regular vision checks. Poor vision and conditions such as cataracts can make you more likely to have a fall. If you wear glasses, make sure to get your prescription updated if your vision changes. Medicine review. Work with your health care provider to regularly review all of the medicines you are taking, including over-the-counter medicines. Ask your health care provider about any side effects that may make you more likely to have a fall. Tell your health care provider if any medicines that you take make you feel dizzy or sleepy. Strength and balance checks. Your health care provider may recommend certain tests to check your strength and balance while standing, walking, or changing positions. Foot health exam. Foot pain and numbness, as well as not wearing proper footwear, can make you more likely to have a fall. Screenings, including: Osteoporosis screening. Osteoporosis is a condition that causes the bones to get weaker and break more easily. Blood pressure screening. Blood pressure changes and medicines to control blood pressure can make you feel dizzy. Depression screening. You may be more likely to have a fall if you have a fear of falling, feel depressed, or feel unable to do activities that you used to do. Alcohol use screening. Using too much alcohol can affect your balance and may make you more likely to have a fall. Follow these instructions at home: Lifestyle Do not drink alcohol if: Your health care provider tells you not to drink. If you drink alcohol: Limit how much you have to: 0-1 drink a day for women. 0-2  drinks a day for men. Know how much alcohol is in your drink.  In the [place] one drink equals one 12 oz bottle of beer (355 mL), one 5 oz glass of wine (148 mL), or one 1 oz glass of hard liquor (44 mL). Do not use any products that contain nicotine or tobacco. These products include cigarettes, chewing tobacco, and vaping devices, such as e-cigarettes. If you need help quitting, ask your health care provider. Activity  Follow a regular exercise program to stay fit. This will help you maintain your balance. Ask your health care provider what types of exercise are appropriate for you. If you need a cane or walker, use it as recommended by your health care provider. Wear supportive shoes that have nonskid soles. Safety  Remove any tripping hazards, such as rugs, cords, and clutter. Install safety equipment such as grab bars in bathrooms and safety rails on stairs. Keep rooms and walkways well-lit. General instructions Talk with your health care provider about your risks for falling. Tell your health care provider if: You fall. Be sure to tell your health care provider about all falls, even ones that seem minor. You feel dizzy, tiredness (fatigue), or off-balance. Take over-the-counter and prescription medicines only as told by your health care provider. These include supplements. Eat a healthy diet and maintain a healthy weight. A healthy diet includes low-fat dairy products, low-fat (lean) meats, and fiber from whole grains, beans, and lots of fruits and vegetables. Stay current with your vaccines. Schedule regular health, dental, and eye exams. Summary Having a healthy lifestyle and getting preventive care can help to protect your health and wellness after age 65. Screening and testing are the best way to find a health problem early and help you avoid having a fall. Early diagnosis and treatment give you the best chance for managing medical conditions that are more common for people who are older than age 20. Falls are a major cause of broken bones and  head injuries in people who are older than age 62. Take precautions to prevent a fall at home. Work with your health care provider to learn what changes you can make to improve your health and wellness and to prevent falls. This information is not intended to replace advice given to you by your health care provider. Make sure you discuss any questions you have with your health care provider. Document Revised: 02/06/2021 Document Reviewed: 02/06/2021 Elsevier Patient Education  2024 Elsevier Inc.     Edwina Barth, MD [facility] ([place])

## 2023-03-13 NOTE — Assessment & Plan Note (Signed)
Clinically euthyroid. TSH done today Continue Synthroid 50 mcg daily. 

## 2023-03-13 NOTE — Assessment & Plan Note (Signed)
Stable.  Under surveillance. Scheduled for CT angio next December

## 2023-03-14 LAB — HEMOGLOBIN A1C
Est. average glucose Bld gHb Est-mCnc: 117 mg/dL
Hgb A1c MFr Bld: 5.7 % — ABNORMAL HIGH (ref 4.8–5.6)

## 2023-03-14 LAB — LIPID PANEL
Chol/HDL Ratio: 3.2 ratio (ref 0.0–5.0)
Cholesterol, Total: 172 mg/dL (ref 100–199)
HDL: 54 mg/dL (ref >39)
LDL Chol Calc (NIH): 106 mg/dL — ABNORMAL HIGH (ref 0–99)
Triglycerides: 63 mg/dL (ref 0–149)
VLDL Cholesterol Cal: 12 mg/dL (ref 5–40)

## 2023-03-14 LAB — COMPREHENSIVE METABOLIC PANEL
ALT: 12 IU/L (ref 0–44)
AST: 18 IU/L (ref 0–40)
Albumin/Globulin Ratio: 1.9
Albumin: 4.4 g/dL (ref 3.8–4.8)
Alkaline Phosphatase: 78 IU/L (ref 44–121)
BUN/Creatinine Ratio: 18 (ref 10–24)
BUN: 19 mg/dL (ref 8–27)
Bilirubin Total: 0.7 mg/dL (ref 0.0–1.2)
CO2: 26 mmol/L (ref 20–29)
Calcium: 9.4 mg/dL (ref 8.6–10.2)
Chloride: 104 mmol/L (ref 96–106)
Creatinine, Ser: 1.05 mg/dL (ref 0.76–1.27)
Globulin, Total: 2.3 g/dL (ref 1.5–4.5)
Glucose: 84 mg/dL (ref 70–99)
Potassium: 4.6 mmol/L (ref 3.5–5.2)
Sodium: 141 mmol/L (ref 134–144)
Total Protein: 6.7 g/dL (ref 6.0–8.5)
eGFR: 76 mL/min/{1.73_m2} (ref >59)

## 2023-03-14 LAB — CBC WITH DIFFERENTIAL/PLATELET
Basophils Absolute: 0.1 10*3/uL (ref 0.0–0.2)
Basos: 1 %
EOS (ABSOLUTE): 0.2 10*3/uL (ref 0.0–0.4)
Eos: 5 %
Hematocrit: 44.1 % (ref 37.5–51.0)
Hemoglobin: 14.8 g/dL (ref 13.0–17.7)
Immature Grans (Abs): 0 10*3/uL (ref 0.0–0.1)
Immature Granulocytes: 0 %
Lymphocytes Absolute: 1.1 10*3/uL (ref 0.7–3.1)
Lymphs: 28 %
MCH: 29.4 pg (ref 26.6–33.0)
MCHC: 33.6 g/dL (ref 31.5–35.7)
MCV: 88 fL (ref 79–97)
Monocytes Absolute: 0.4 10*3/uL (ref 0.1–0.9)
Monocytes: 9 %
Neutrophils Absolute: 2.3 10*3/uL (ref 1.4–7.0)
Neutrophils: 57 %
Platelets: 173 10*3/uL (ref 150–450)
RBC: 5.03 x10E6/uL (ref 4.14–5.80)
RDW: 13.3 % (ref 11.6–15.4)
WBC: 4.1 10*3/uL (ref 3.4–10.8)

## 2023-03-14 LAB — TSH: TSH: 2.45 u[IU]/mL (ref 0.450–4.500)

## 2023-06-25 ENCOUNTER — Telehealth: Payer: Self-pay | Admitting: Emergency Medicine

## 2023-06-25 ENCOUNTER — Encounter: Payer: Self-pay | Admitting: Emergency Medicine

## 2023-06-25 NOTE — Telephone Encounter (Signed)
Called [facility] to give verbal ok for manufacturer change for medication

## 2023-06-25 NOTE — Telephone Encounter (Signed)
Provider is not in office today. Will send message to provider for approval   Please advise

## 2023-06-25 NOTE — Telephone Encounter (Signed)
A representative from [facility] called and said they are changing the manufacturer for patient's levothyroxine. The new manufacturer is [employer]. They would like provider approval for this change.  Patient is almost out of medication, so [facility] would like to ship the new medication today. They would like to hear back ASAP to make that possible.  Best callback is [phone].  Reference number is [id number].

## 2023-08-20 ENCOUNTER — Other Ambulatory Visit: Payer: Self-pay | Admitting: Cardiology

## 2023-08-20 DIAGNOSIS — I4811 Longstanding persistent atrial fibrillation: Secondary | ICD-10-CM

## 2023-08-20 NOTE — Telephone Encounter (Signed)
Xarelto 20mg  refill request received. Pt is 72  years old, weight-85.3kg, Crea-1.05 on 03/13/23, last seen by Dr. Antoine Poche on  09/20/22, Diagnosis-Afib, CrCl-76.72 mL/min; Dose is appropriate based on dosing criteria. Will send in refill to requested pharmacy.

## 2023-08-25 ENCOUNTER — Other Ambulatory Visit: Payer: Self-pay | Admitting: Emergency Medicine

## 2023-08-25 DIAGNOSIS — E063 Autoimmune thyroiditis: Secondary | ICD-10-CM

## 2023-08-27 DIAGNOSIS — L817 Pigmented purpuric dermatosis: Secondary | ICD-10-CM | POA: Diagnosis not present

## 2023-08-27 DIAGNOSIS — L570 Actinic keratosis: Secondary | ICD-10-CM | POA: Diagnosis not present

## 2023-08-28 DIAGNOSIS — I341 Nonrheumatic mitral (valve) prolapse: Secondary | ICD-10-CM | POA: Diagnosis not present

## 2023-08-28 DIAGNOSIS — I4891 Unspecified atrial fibrillation: Secondary | ICD-10-CM | POA: Diagnosis not present

## 2023-08-28 DIAGNOSIS — I7121 Aneurysm of the ascending aorta, without rupture: Secondary | ICD-10-CM | POA: Diagnosis not present

## 2023-08-28 LAB — BASIC METABOLIC PANEL
BUN/Creatinine Ratio: 17 (ref 10–24)
BUN: 19 mg/dL (ref 8–27)
CO2: 27 mmol/L (ref 20–29)
Calcium: 9.4 mg/dL (ref 8.6–10.2)
Chloride: 104 mmol/L (ref 96–106)
Creatinine, Ser: 1.09 mg/dL (ref 0.76–1.27)
Glucose: 78 mg/dL (ref 70–99)
Potassium: 4.2 mmol/L (ref 3.5–5.2)
Sodium: 143 mmol/L (ref 134–144)
eGFR: 72 mL/min/{1.73_m2} (ref >59)

## 2023-09-03 NOTE — Progress Notes (Signed)
Results not seen on [facility]. Letter of results mailed to patient.

## 2023-09-05 ENCOUNTER — Ambulatory Visit (HOSPITAL_COMMUNITY)
Admission: RE | Admit: 2023-09-05 | Discharge: 2023-09-05 | Disposition: A | Discharge status: Home / Self Care | Payer: Medicare Other | Source: Ambulatory Visit | Attending: Cardiology | Admitting: Cardiology

## 2023-09-05 DIAGNOSIS — I7121 Aneurysm of the ascending aorta, without rupture: Secondary | ICD-10-CM | POA: Insufficient documentation

## 2023-09-05 DIAGNOSIS — I4891 Unspecified atrial fibrillation: Secondary | ICD-10-CM | POA: Insufficient documentation

## 2023-09-05 DIAGNOSIS — I341 Nonrheumatic mitral (valve) prolapse: Secondary | ICD-10-CM | POA: Diagnosis not present

## 2023-09-05 MED ORDER — IOHEXOL 350 MG/ML SOLN
75.0000 mL | Freq: Once | INTRAVENOUS | Status: AC | PRN
  Administered 2023-09-05: 75 mL via INTRAVENOUS

## 2023-09-19 NOTE — Progress Notes (Addendum)
  Cardiology Office Note:   Date:  09/20/2023  ID:  Ethan Lee, DOB 01-29-1951, MRN [id number] PCP: Georgina Quint, MD  [facility] Providers Cardiologist:  Rollene Rotunda, MD {  History of Present Illness:   Ethan Lee is a 72 y.o. male who presents for follow up of atrial fib.   He had cardioversion in January 2023.    He went back into atrial fib.   Echo this 08/2022 did not demonstrate any MVP and only mild MR.    Since I last saw him he has done well.  The patient denies any new symptoms such as chest discomfort, neck or arm discomfort. There has been no new shortness of breath, PND or orthopnea. There have been no reported palpitations, presyncope or syncope. He retired.  He walks for exercise.   ROS: As stated in the HPI and negative for all other systems.  Studies Reviewed:    EKG:   EKG Interpretation Date/Time:  Friday September 20 2023 08:31:02 EST Ventricular Rate:  58 PR Interval:    QRS Duration:  108 QT Interval:  428 QTC Calculation: 420 R Axis:   -68  Text Interpretation: Atrial fibrillation with slow ventricular response Left anterior fascicular block Minimal voltage criteria for LVH, may be normal variant ( Cornell product ) Septal infarct (cited on or before 20-Sep-2023) When compared with ECG of 24-Oct-2021 08:16, Atrial fibrillation has replaced Sinus rhythm No significant change since last tracing Confirmed by Rollene Rotunda ([id number]) on 09/20/2023 8:48:23 AM  Risk Assessment/Calculations:    CHA2DS2-VASc Score = 1  This indicates a 0.6% annual risk of stroke. The patient's score is based upon: CHF History: 0 HTN History: 0 Diabetes History: 0 Stroke History: 0 Vascular Disease History: 0 Age Score: 1 Gender Score: 0       Physical Exam:   VS:  BP 96/69   Pulse 77   Ht 6\' 1"  (1.854 m)   Wt 184 lb (83.5 kg)   SpO2 98%   BMI 24.28 kg/m     Wt Readings from Last 3 Encounters:  09/20/23 184 lb (83.5 kg)  03/13/23 188 lb  2 oz (85.3 kg)  09/20/22 185 lb 3.2 oz (84 kg)     GEN: Well nourished, well developed in no acute distress NECK: No JVD; No carotid bruits CARDIAC: Irregular RR, no murmurs, rubs, gallops RESPIRATORY:  Clear to auscultation without rales, wheezing or rhonchi  ABDOMEN: Soft, non-tender, non-distended EXTREMITIES:  No edema; No deformity   ASSESSMENT AND PLAN:   ATRIAL FIB:     He is tolerating anticoagulation.  He has good rate control.  We talked about the fact that given the fact that he is asymptomatic he is not really wanted to consider ablation.  He understands that he is a low risk for embolic strokes but he would prefer to remain on the Eliquis and I agree with this.  MVP:   There was no evidence of prolapse and only mild MR in 2023.  No change in therapy   Enlarged aorta: This was  39 mm in Dec 2024. No change in therapy.    Follow up with me in one year.   Signed, Rollene Rotunda, MD

## 2023-09-20 ENCOUNTER — Ambulatory Visit: Payer: Medicare Other | Attending: Cardiology | Admitting: Cardiology

## 2023-09-20 ENCOUNTER — Ambulatory Visit: Payer: 59 | Admitting: Cardiology

## 2023-09-20 ENCOUNTER — Encounter: Payer: Self-pay | Admitting: Cardiology

## 2023-09-20 VITALS — BP 96/69 | HR 77 | Ht 73.0 in | Wt 184.0 lb

## 2023-09-20 DIAGNOSIS — I4891 Unspecified atrial fibrillation: Secondary | ICD-10-CM

## 2023-09-20 NOTE — Patient Instructions (Signed)
 Medication Instructions:  No changes.  *If you need a refill on your cardiac medications before your next appointment, please call your pharmacy*   Follow-Up: At [facility], you and your health needs are our priority.  As part of our continuing mission to provide you with exceptional heart care, we have created designated Provider Care Teams.  These Care Teams include your primary Cardiologist (physician) and Advanced Practice Providers (APPs -  Physician Assistants and Nurse Practitioners) who all work together to provide you with the care you need, when you need it.  We recommend signing up for the patient portal called "MyChart".  Sign up information is provided on this After Visit Summary.  MyChart is used to connect with patients for Virtual Visits (Telemedicine).  Patients are able to view lab/test results, encounter notes, upcoming appointments, etc.  Non-urgent messages can be sent to your provider as well.   To learn more about what you can do with MyChart, go to [email or web address].    Your next appointment:   12 month(s)  Provider:   Rollene Rotunda, MD

## 2023-10-14 DIAGNOSIS — L821 Other seborrheic keratosis: Secondary | ICD-10-CM | POA: Diagnosis not present

## 2023-10-14 DIAGNOSIS — L814 Other melanin hyperpigmentation: Secondary | ICD-10-CM | POA: Diagnosis not present

## 2023-10-14 DIAGNOSIS — L570 Actinic keratosis: Secondary | ICD-10-CM | POA: Diagnosis not present

## 2023-10-14 DIAGNOSIS — D1801 Hemangioma of skin and subcutaneous tissue: Secondary | ICD-10-CM | POA: Diagnosis not present

## 2023-10-14 DIAGNOSIS — D2339 Other benign neoplasm of skin of other parts of face: Secondary | ICD-10-CM | POA: Diagnosis not present

## 2023-10-14 DIAGNOSIS — D485 Neoplasm of uncertain behavior of skin: Secondary | ICD-10-CM | POA: Diagnosis not present

## 2023-11-21 DIAGNOSIS — H40013 Open angle with borderline findings, low risk, bilateral: Secondary | ICD-10-CM | POA: Diagnosis not present

## 2023-12-23 ENCOUNTER — Encounter: Payer: Self-pay | Admitting: Emergency Medicine

## 2023-12-23 ENCOUNTER — Ambulatory Visit: Admitting: Emergency Medicine

## 2023-12-23 ENCOUNTER — Other Ambulatory Visit: Payer: Self-pay | Admitting: Radiology

## 2023-12-23 VITALS — BP 110/80 | HR 59 | Temp 97.6°F | Ht 73.0 in | Wt 181.0 lb

## 2023-12-23 DIAGNOSIS — C61 Malignant neoplasm of prostate: Secondary | ICD-10-CM

## 2023-12-23 DIAGNOSIS — Z0000 Encounter for general adult medical examination without abnormal findings: Secondary | ICD-10-CM | POA: Diagnosis not present

## 2023-12-23 DIAGNOSIS — I7121 Aneurysm of the ascending aorta, without rupture: Secondary | ICD-10-CM

## 2023-12-23 DIAGNOSIS — R54 Age-related physical debility: Secondary | ICD-10-CM | POA: Diagnosis not present

## 2023-12-23 DIAGNOSIS — Z125 Encounter for screening for malignant neoplasm of prostate: Secondary | ICD-10-CM

## 2023-12-23 DIAGNOSIS — E039 Hypothyroidism, unspecified: Secondary | ICD-10-CM

## 2023-12-23 DIAGNOSIS — Z13228 Encounter for screening for other metabolic disorders: Secondary | ICD-10-CM | POA: Diagnosis not present

## 2023-12-23 DIAGNOSIS — I4811 Longstanding persistent atrial fibrillation: Secondary | ICD-10-CM

## 2023-12-23 DIAGNOSIS — Z1329 Encounter for screening for other suspected endocrine disorder: Secondary | ICD-10-CM | POA: Diagnosis not present

## 2023-12-23 DIAGNOSIS — Z13220 Encounter for screening for lipoid disorders: Secondary | ICD-10-CM

## 2023-12-23 DIAGNOSIS — Z130 Encounter for screening for diseases of the blood and blood-forming organs and certain disorders involving the immune mechanism: Secondary | ICD-10-CM

## 2023-12-23 NOTE — Patient Instructions (Signed)
 Health Maintenance After Age 73 After age 4, you are at a higher risk for certain long-term diseases and infections as well as injuries from falls. Falls are a major cause of broken bones and head injuries in people who are older than age 47. Getting regular preventive care can help to keep you healthy and well. Preventive care includes getting regular testing and making lifestyle changes as recommended by your health care provider. Talk with your health care provider about: Which screenings and tests you should have. A screening is a test that checks for a disease when you have no symptoms. A diet and exercise plan that is right for you. What should I know about screenings and tests to prevent falls? Screening and testing are the best ways to find a health problem early. Early diagnosis and treatment give you the best chance of managing medical conditions that are common after age 37. Certain conditions and lifestyle choices may make you more likely to have a fall. Your health care provider may recommend: Regular vision checks. Poor vision and conditions such as cataracts can make you more likely to have a fall. If you wear glasses, make sure to get your prescription updated if your vision changes. Medicine review. Work with your health care provider to regularly review all of the medicines you are taking, including over-the-counter medicines. Ask your health care provider about any side effects that may make you more likely to have a fall. Tell your health care provider if any medicines that you take make you feel dizzy or sleepy. Strength and balance checks. Your health care provider may recommend certain tests to check your strength and balance while standing, walking, or changing positions. Foot health exam. Foot pain and numbness, as well as not wearing proper footwear, can make you more likely to have a fall. Screenings, including: Osteoporosis screening. Osteoporosis is a condition that causes  the bones to get weaker and break more easily. Blood pressure screening. Blood pressure changes and medicines to control blood pressure can make you feel dizzy. Depression screening. You may be more likely to have a fall if you have a fear of falling, feel depressed, or feel unable to do activities that you used to do. Alcohol use screening. Using too much alcohol can affect your balance and may make you more likely to have a fall. Follow these instructions at home: Lifestyle Do not drink alcohol if: Your health care provider tells you not to drink. If you drink alcohol: Limit how much you have to: 0-1 drink a day for women. 0-2 drinks a day for men. Know how much alcohol is in your drink. In the [place] one drink equals one 12 oz bottle of beer (355 mL), one 5 oz glass of wine (148 mL), or one 1 oz glass of hard liquor (44 mL). Do not use any products that contain nicotine or tobacco. These products include cigarettes, chewing tobacco, and vaping devices, such as e-cigarettes. If you need help quitting, ask your health care provider. Activity  Follow a regular exercise program to stay fit. This will help you maintain your balance. Ask your health care provider what types of exercise are appropriate for you. If you need a cane or walker, use it as recommended by your health care provider. Wear supportive shoes that have nonskid soles. Safety  Remove any tripping hazards, such as rugs, cords, and clutter. Install safety equipment such as grab bars in bathrooms and safety rails on stairs. Keep rooms and walkways  well-lit. General instructions Talk with your health care provider about your risks for falling. Tell your health care provider if: You fall. Be sure to tell your health care provider about all falls, even ones that seem minor. You feel dizzy, tiredness (fatigue), or off-balance. Take over-the-counter and prescription medicines only as told by your health care provider. These include  supplements. Eat a healthy diet and maintain a healthy weight. A healthy diet includes low-fat dairy products, low-fat (lean) meats, and fiber from whole grains, beans, and lots of fruits and vegetables. Stay current with your vaccines. Schedule regular health, dental, and eye exams. Summary Having a healthy lifestyle and getting preventive care can help to protect your health and wellness after age 11. Screening and testing are the best way to find a health problem early and help you avoid having a fall. Early diagnosis and treatment give you the best chance for managing medical conditions that are more common for people who are older than age 28. Falls are a major cause of broken bones and head injuries in people who are older than age 48. Take precautions to prevent a fall at home. Work with your health care provider to learn what changes you can make to improve your health and wellness and to prevent falls. This information is not intended to replace advice given to you by your health care provider. Make sure you discuss any questions you have with your health care provider. Document Revised: 02/06/2021 Document Reviewed: 02/06/2021 Elsevier Patient Education  2024 [employer].

## 2023-12-23 NOTE — Assessment & Plan Note (Signed)
 Stable CT scan from 09/07/2023 as follows: IMPRESSION: 1. Dilation of the ascending thoracic aorta measuring up to 3.9 cm, without evidence of frank aneurysm or aortic dissection. 2. Cardiomegaly, with prominent biatrial dilatation compatible with given history of atrial fibrillation. 3. No acute intrathoracic process.

## 2023-12-23 NOTE — Progress Notes (Signed)
 Subjective:    Ethan Lee is a 73 y.o. male who presents for a Welcome to Medicare exam.         Objective:    Today's Vitals   12/23/23 0842  BP: 110/80  Pulse: (!) 59  Temp: 97.6 F (36.4 C)  TempSrc: Oral  SpO2: 94%  Weight: 181 lb (82.1 kg)  Height: 6\' 1"  (1.854 m)   Body mass index is 23.88 kg/m.  Medications Outpatient Encounter Medications as of 12/23/2023  Medication Sig   cetirizine (ZYRTEC) 10 MG tablet Take 10 mg by mouth at bedtime.   Cetirizine HCl (ZYRTEC ALLERGY) 10 MG CAPS at bedtime.   cholecalciferol (VITAMIN D) 1000 UNITS tablet Take 1,000 Units by mouth daily.   EPINEPHrine 0.3 mg/0.3 mL IJ SOAJ injection Inject 0.3 mg into the muscle as needed for anaphylaxis.   levothyroxine (SYNTHROID) 50 MCG tablet TAKE 1 TABLET BY MOUTH DAILY  BEFORE BREAKFAST   rivaroxaban (XARELTO) 20 MG TABS tablet TAKE 1 TABLET BY MOUTH DAILY  WITH SUPPER   vitamin B-12 (CYANOCOBALAMIN) 100 MCG tablet Take 100 mcg by mouth daily.   vitamin C (ASCORBIC ACID) 500 MG tablet Take 500 mg by mouth daily. Two tabs   vitamin E 400 UNIT capsule Take 400 Units by mouth daily.   No facility-administered encounter medications on file as of 12/23/2023.     History: Past Medical History:  Diagnosis Date   Allergy    Hypothyroid    Past Surgical History:  Procedure Laterality Date   CARDIOVERSION N/A 10/24/2021   Procedure: CARDIOVERSION;  Surgeon: Thomasene Ripple, DO;  Location: [facility];  Service: Cardiovascular;  Laterality: N/A;   KNEE SURGERY     x 4   TONSILLECTOMY AND ADENOIDECTOMY     Vocal cord polyps      Family History  Problem Relation Age of Onset   Cancer Mother        COLON CANCER   Leukemia Father    Other Father        blood disorder   Cancer Brother        testicular cancer   Cancer Brother        liver/kidney cancer   Social History   Occupational History    Employer: [employer]  Tobacco Use   Smoking status: Never   Smokeless tobacco:  Never  Substance and Sexual Activity   Alcohol use: Yes    Alcohol/week: 3.0 standard drinks of alcohol    Types: 3 Glasses of wine per week   Drug use: No   Sexual activity: Yes    Tobacco Counseling Non-smoker  Immunizations and Health Maintenance Immunization History  Administered Date(s) Administered   Fluad Quad(high Dose 65+) 07/15/2022   Influenza Split 08/20/2013, 08/09/2015   Influenza, High Dose Seasonal PF 10/15/2018, 06/19/2019   Influenza,inj,Quad PF,6+ Mos 07/26/2016, 08/09/2017, 07/14/2018   Influenza-Unspecified 07/14/2018, 07/07/2020, 07/31/2021   PFIZER(Purple Top)SARS-COV-2 Vaccination 12/02/2019, 12/23/2019, 09/05/2020, 07/20/2021   Pneumococcal Conjugate-13 08/09/2017   Pneumococcal Polysaccharide-23 04/23/2011, 07/26/2016, 10/15/2018   Td (Adult) 09/11/2005   Tdap 04/23/2011, 05/08/2016   Zoster Recombinant(Shingrix) 09/07/2021, 12/08/2021   Zoster, Live 07/01/2012   Health Maintenance Due  Topic Date Due   Medicare Annual Wellness (AWV)  Never done   INFLUENZA VACCINE  05/02/2023   COVID-19 Vaccine (5 - 2024-25 season) 06/02/2023    Activities of Daily Living    12/19/2023   11:44 AM  In your present state of health, do you have any difficulty performing  the following activities:  Hearing? 0  Vision? 0  Difficulty concentrating or making decisions? 0  Walking or climbing stairs? 0  Dressing or bathing? 0  Doing errands, shopping? 0  Preparing Food and eating ? N  Using the Toilet? N  In the past six months, have you accidently leaked urine? N  Do you have problems with loss of bowel control? N  Managing your Medications? N  Managing your Finances? N  Housekeeping or managing your Housekeeping? N    Physical Exam   Physical Exam Vitals reviewed.  Constitutional:      Appearance: Normal appearance.  HENT:     Head: Normocephalic.     Right Ear: Tympanic membrane, ear canal and external ear normal.     Left Ear: Tympanic membrane, ear  canal and external ear normal.     Mouth/Throat:     Mouth: Mucous membranes are moist.     Pharynx: Oropharynx is clear.  Eyes:     Extraocular Movements: Extraocular movements intact.     Conjunctiva/sclera: Conjunctivae normal.     Pupils: Pupils are equal, round, and reactive to light.  Cardiovascular:     Rate and Rhythm: Normal rate. Rhythm irregular.     Pulses: Normal pulses.     Heart sounds: Normal heart sounds.  Pulmonary:     Effort: Pulmonary effort is normal.     Breath sounds: Normal breath sounds.  Abdominal:     Palpations: Abdomen is soft.     Tenderness: There is no abdominal tenderness.  Skin:    General: Skin is warm and dry.     Capillary Refill: Capillary refill takes less than 2 seconds.  Neurological:     General: No focal deficit present.     Mental Status: He is alert and oriented to person, place, and time.  Psychiatric:        Mood and Affect: Mood normal.        Behavior: Behavior normal.       Advanced Directives:  None at present time   EKG:  Last EKG from cardiology visit on 09/20/2023 shows atrial fibrillation with controlled ventricular rate     Assessment:    This is a routine wellness  examination for this patient . Problem List Items Addressed This Visit       Cardiovascular and Mediastinum   Aneurysm of ascending aorta without rupture (HCC)   Stable CT scan from 09/07/2023 as follows: IMPRESSION: 1. Dilation of the ascending thoracic aorta measuring up to 3.9 cm, without evidence of frank aneurysm or aortic dissection. 2. Cardiomegaly, with prominent biatrial dilatation compatible with given history of atrial fibrillation. 3. No acute intrathoracic process.      Atrial fibrillation (HCC)   Long-term persistent.  Well-controlled ventricular rate Continues Xarelto 20 mg daily.  No bleeding episodes or complications Fall precautions given      Relevant Orders   Comprehensive metabolic panel     Endocrine    Hypothyroidism   Clinically euthyroid. TSH done today. Continue Synthroid 50 mcg daily.      Relevant Orders   TSH   Other Visit Diagnoses       Routine general medical examination at a health care facility    -  Primary     Prostate cancer screening       Relevant Orders   PSA     Screening for deficiency anemia       Relevant Orders   CBC with Differential/Platelet  Screening for lipoid disorders       Relevant Orders   Lipid panel     Screening for endocrine, metabolic and immunity disorder       Relevant Orders   Comprehensive metabolic panel   Hemoglobin A1c     Encounter for Medicare annual wellness exam            Vision/Hearing screen Normal hearing   Goals   None      Depression Screen    12/23/2023    8:41 AM 03/13/2023    8:51 AM 09/11/2022    8:49 AM 08/14/2021    4:48 PM  PHQ 2/9 Scores  PHQ - 2 Score 0 0 0 0     Fall Risk    12/23/2023    8:41 AM  Fall Risk   Falls in the past year? 0  Number falls in past yr: 0  Injury with Fall? 0  Risk for fall due to : No Fall Risks  Follow up Falls evaluation completed    Cognitive Function Alert and oriented x 3 in no apparent respiratory distress No cognitive deficit identified       Patient Instructions  Health Maintenance After Age 63 After age 21, you are at a higher risk for certain long-term diseases and infections as well as injuries from falls. Falls are a major cause of broken bones and head injuries in people who are older than age 52. Getting regular preventive care can help to keep you healthy and well. Preventive care includes getting regular testing and making lifestyle changes as recommended by your health care provider. Talk with your health care provider about: Which screenings and tests you should have. A screening is a test that checks for a disease when you have no symptoms. A diet and exercise plan that is right for you. What should I know about screenings and tests to  prevent falls? Screening and testing are the best ways to find a health problem early. Early diagnosis and treatment give you the best chance of managing medical conditions that are common after age 94. Certain conditions and lifestyle choices may make you more likely to have a fall. Your health care provider may recommend: Regular vision checks. Poor vision and conditions such as cataracts can make you more likely to have a fall. If you wear glasses, make sure to get your prescription updated if your vision changes. Medicine review. Work with your health care provider to regularly review all of the medicines you are taking, including over-the-counter medicines. Ask your health care provider about any side effects that may make you more likely to have a fall. Tell your health care provider if any medicines that you take make you feel dizzy or sleepy. Strength and balance checks. Your health care provider may recommend certain tests to check your strength and balance while standing, walking, or changing positions. Foot health exam. Foot pain and numbness, as well as not wearing proper footwear, can make you more likely to have a fall. Screenings, including: Osteoporosis screening. Osteoporosis is a condition that causes the bones to get weaker and break more easily. Blood pressure screening. Blood pressure changes and medicines to control blood pressure can make you feel dizzy. Depression screening. You may be more likely to have a fall if you have a fear of falling, feel depressed, or feel unable to do activities that you used to do. Alcohol use screening. Using too much alcohol can affect your balance and may make  you more likely to have a fall. Follow these instructions at home: Lifestyle Do not drink alcohol if: Your health care provider tells you not to drink. If you drink alcohol: Limit how much you have to: 0-1 drink a day for women. 0-2 drinks a day for men. Know how much alcohol is in your  drink. In the [place] one drink equals one 12 oz bottle of beer (355 mL), one 5 oz glass of wine (148 mL), or one 1 oz glass of hard liquor (44 mL). Do not use any products that contain nicotine or tobacco. These products include cigarettes, chewing tobacco, and vaping devices, such as e-cigarettes. If you need help quitting, ask your health care provider. Activity  Follow a regular exercise program to stay fit. This will help you maintain your balance. Ask your health care provider what types of exercise are appropriate for you. If you need a cane or walker, use it as recommended by your health care provider. Wear supportive shoes that have nonskid soles. Safety  Remove any tripping hazards, such as rugs, cords, and clutter. Install safety equipment such as grab bars in bathrooms and safety rails on stairs. Keep rooms and walkways well-lit. General instructions Talk with your health care provider about your risks for falling. Tell your health care provider if: You fall. Be sure to tell your health care provider about all falls, even ones that seem minor. You feel dizzy, tiredness (fatigue), or off-balance. Take over-the-counter and prescription medicines only as told by your health care provider. These include supplements. Eat a healthy diet and maintain a healthy weight. A healthy diet includes low-fat dairy products, low-fat (lean) meats, and fiber from whole grains, beans, and lots of fruits and vegetables. Stay current with your vaccines. Schedule regular health, dental, and eye exams. Summary Having a healthy lifestyle and getting preventive care can help to protect your health and wellness after age 71. Screening and testing are the best way to find a health problem early and help you avoid having a fall. Early diagnosis and treatment give you the best chance for managing medical conditions that are more common for people who are older than age 24. Falls are a major cause of broken bones  and head injuries in people who are older than age 13. Take precautions to prevent a fall at home. Work with your health care provider to learn what changes you can make to improve your health and wellness and to prevent falls. This information is not intended to replace advice given to you by your health care provider. Make sure you discuss any questions you have with your health care provider. Document Revised: 02/06/2021 Document Reviewed: 02/06/2021 Elsevier Patient Education  2024 [employer]   Patient Care Team: Georgina Quint, MD as PCP - General (Internal Medicine) Rollene Rotunda, MD as PCP - Cardiology (Cardiology) Rollene Rotunda, MD as Consulting Physician (Cardiology)     Plan:     I have personally reviewed and noted the following in the patient's chart:   Medical and social history Use of alcohol, tobacco or illicit drugs  Current medications and supplements Functional ability and status Nutritional status Physical activity Advanced directives List of other physicians Hospitalizations, surgeries, and ER visits in previous 12 months Vitals Screenings to include cognitive, depression, and falls Referrals and appointments  In addition, I have reviewed and discussed with patient certain preventive protocols, quality metrics, and best practice recommendations. A written personalized care plan for preventive services as well as  general preventive health recommendations were provided to patient.     [address] [place], [place] 12/23/2023

## 2023-12-23 NOTE — Assessment & Plan Note (Signed)
Long-term persistent.  Well-controlled ventricular rate Continues Xarelto 20 mg daily.  No bleeding episodes or complications Fall precautions given

## 2023-12-23 NOTE — Assessment & Plan Note (Signed)
Clinically euthyroid. TSH done today Continue Synthroid 50 mcg daily.

## 2023-12-24 ENCOUNTER — Encounter: Payer: Self-pay | Admitting: Emergency Medicine

## 2023-12-24 LAB — COMPREHENSIVE METABOLIC PANEL
ALT: 11 IU/L (ref 0–44)
AST: 20 IU/L (ref 0–40)
Albumin: 4.3 g/dL (ref 3.8–4.8)
Alkaline Phosphatase: 64 IU/L (ref 44–121)
BUN/Creatinine Ratio: 26 — ABNORMAL HIGH (ref 10–24)
BUN: 23 mg/dL (ref 8–27)
Bilirubin Total: 0.9 mg/dL (ref 0.0–1.2)
CO2: 22 mmol/L (ref 20–29)
Calcium: 9.3 mg/dL (ref 8.6–10.2)
Chloride: 105 mmol/L (ref 96–106)
Creatinine, Ser: 0.9 mg/dL (ref 0.76–1.27)
Globulin, Total: 2.1 g/dL (ref 1.5–4.5)
Glucose: 85 mg/dL (ref 70–99)
Potassium: 4.3 mmol/L (ref 3.5–5.2)
Sodium: 143 mmol/L (ref 134–144)
Total Protein: 6.4 g/dL (ref 6.0–8.5)
eGFR: 91 mL/min/{1.73_m2} (ref >59)

## 2023-12-24 LAB — CBC WITH DIFFERENTIAL/PLATELET
Basophils Absolute: 0 10*3/uL (ref 0.0–0.2)
Basos: 1 %
EOS (ABSOLUTE): 0.2 10*3/uL (ref 0.0–0.4)
Eos: 5 %
Hematocrit: 42.6 % (ref 37.5–51.0)
Hemoglobin: 14.3 g/dL (ref 13.0–17.7)
Immature Grans (Abs): 0 10*3/uL (ref 0.0–0.1)
Immature Granulocytes: 0 %
Lymphocytes Absolute: 1.2 10*3/uL (ref 0.7–3.1)
Lymphs: 33 %
MCH: 29 pg (ref 26.6–33.0)
MCHC: 33.6 g/dL (ref 31.5–35.7)
MCV: 86 fL (ref 79–97)
Monocytes Absolute: 0.4 10*3/uL (ref 0.1–0.9)
Monocytes: 10 %
Neutrophils Absolute: 1.8 10*3/uL (ref 1.4–7.0)
Neutrophils: 51 %
Platelets: 158 10*3/uL (ref 150–450)
RBC: 4.93 x10E6/uL (ref 4.14–5.80)
RDW: 12.4 % (ref 11.6–15.4)
WBC: 3.6 10*3/uL (ref 3.4–10.8)

## 2023-12-24 LAB — LIPID PANEL
Chol/HDL Ratio: 3 ratio (ref 0.0–5.0)
Cholesterol, Total: 152 mg/dL (ref 100–199)
HDL: 50 mg/dL (ref >39)
LDL Chol Calc (NIH): 91 mg/dL (ref 0–99)
Triglycerides: 55 mg/dL (ref 0–149)
VLDL Cholesterol Cal: 11 mg/dL (ref 5–40)

## 2024-01-02 ENCOUNTER — Other Ambulatory Visit: Payer: Self-pay | Admitting: Radiology

## 2024-01-02 ENCOUNTER — Other Ambulatory Visit: Payer: Self-pay | Admitting: Emergency Medicine

## 2024-01-02 DIAGNOSIS — C61 Malignant neoplasm of prostate: Secondary | ICD-10-CM

## 2024-01-02 DIAGNOSIS — E039 Hypothyroidism, unspecified: Secondary | ICD-10-CM

## 2024-01-03 DIAGNOSIS — E039 Hypothyroidism, unspecified: Secondary | ICD-10-CM | POA: Diagnosis not present

## 2024-01-04 LAB — PSA: Prostate Specific Ag, Serum: 1.1 ng/mL (ref 0.0–4.0)

## 2024-01-04 LAB — TSH: TSH: 2.95 u[IU]/mL (ref 0.450–4.500)

## 2024-01-28 ENCOUNTER — Telehealth: Payer: Self-pay | Admitting: Emergency Medicine

## 2024-01-28 NOTE — Telephone Encounter (Signed)
 Copied from CRM [id number]. Topic: Clinical - Medical Advice >> Jan 28, 2024 12:23 PM Ethan Lee wrote: Reason for CRM: Patient states he hasn't been feeling good within 24hrs, he states his stomach is feeling weird, having soft stools, he has been having headaches. He also says [place] is going [place] the country next week and is concerned if something is being passed around.

## 2024-01-29 ENCOUNTER — Encounter: Payer: Self-pay | Admitting: Radiology

## 2024-01-30 ENCOUNTER — Encounter: Payer: Self-pay | Admitting: Emergency Medicine

## 2024-01-30 ENCOUNTER — Ambulatory Visit: Admitting: Emergency Medicine

## 2024-01-30 VITALS — BP 128/68 | HR 72 | Temp 98.2°F | Ht 73.0 in | Wt 182.6 lb

## 2024-01-30 DIAGNOSIS — E039 Hypothyroidism, unspecified: Secondary | ICD-10-CM | POA: Diagnosis not present

## 2024-01-30 DIAGNOSIS — I4811 Longstanding persistent atrial fibrillation: Secondary | ICD-10-CM

## 2024-01-30 DIAGNOSIS — M62830 Muscle spasm of back: Secondary | ICD-10-CM | POA: Diagnosis not present

## 2024-01-30 DIAGNOSIS — B349 Viral infection, unspecified: Secondary | ICD-10-CM | POA: Diagnosis not present

## 2024-01-30 DIAGNOSIS — M9901 Segmental and somatic dysfunction of cervical region: Secondary | ICD-10-CM | POA: Diagnosis not present

## 2024-01-30 NOTE — Assessment & Plan Note (Signed)
 Clinically euthyroid. Continue Synthroid  50 mcg daily. Lab Results  Component Value Date   TSH 2.950 01/03/2024

## 2024-01-30 NOTE — Patient Instructions (Signed)
 Viral Illness, Adult Viruses are tiny germs that can get into a person's body and cause illness. There are many different types of viruses. And they cause many types of illness. Viral illnesses can range from mild to severe. They can affect various parts of the body. Short-term conditions that are caused by a virus include colds and flu (influenza) and stomach viruses. Long-term conditions that are caused by a virus include herpes, shingles, and human immunodeficiency virus (HIV) infection. A few viruses have been linked to certain cancers. What are the causes? Many types of viruses can cause illness. Viruses get into cells in your body, multiply, and cause the infected cells to work differently or die. When these cells die, they release more of the virus. When this happens, you get symptoms of the illness and the virus spreads to other cells. If the virus takes over how the cell works, it can cause the cell to divide and grow out of control. This happens when a virus causes cancer. Different viruses get into the body in different ways. You can get a virus by: Swallowing food or water that has come in contact with the virus. Breathing in droplets that have been coughed or sneezed into the air by an infected person. Touching a surface that has the virus on it and then touching your eyes, nose, or mouth. Being bitten by an insect or animal that carries the virus. Having sexual contact with a person who is infected with the virus. Being exposed to blood or fluids that contain the virus, either through an open cut or during a transfusion. If a virus enters your body, your body's disease-fighting system (immune system) will try to fight the virus. You may be at higher risk for a viral illness if your immune system is weak. What are the signs or symptoms? Symptoms depend on the type of virus and the location of the cells that it gets into. Symptoms can include: For cold and flu  viruses: Fever. Headache. Sore throat. Muscle aches. Stuffy nose (nasal congestion). Cough. For stomach (gastrointestinal) viruses: Fever. Pain in the abdomen. Nausea or vomiting. Diarrhea. For liver viruses (hepatitis): Loss of appetite. Feeling tired. Skin or the white parts of your eyes turning yellow (jaundice). For brain and spinal cord viruses: Fever. Headache. Stiff neck. Nausea and vomiting. Confusion or being sleepy. For skin viruses: Warts. Itching. Rash. For sexually transmitted viruses: Discharge. Swelling. Redness. Rash. How is this diagnosed? This condition may be diagnosed based on one or more of these: Your symptoms and medical history. A physical exam. Tests, such as: Blood tests. Tests on a sample of mucus from your lungs (sputum sample). Tests on a poop (stool) sample. Tests on a swab of body fluids or a skin sore (lesion). How is this treated? Viruses can be hard to treat because they live within cells. Antibiotics do not treat viruses because these medicines do not get inside cells. Treatment for a viral illness may include: Resting and drinking a lot of fluids. Medicines to treat symptoms. These can include over-the-counter medicine for pain and fever, medicines for cough or congestion, and medicines for diarrhea. Antiviral medicines. These medicines are available only for certain types of viruses. Some viral illnesses can be prevented with vaccinations. A common example is the flu shot. Follow these instructions at home: Medicines Take over-the-counter and prescription medicines only as told by your health care provider. If you were prescribed an antiviral medicine, take it as told by your provider. Do not stop  taking the antiviral even if you start to feel better. Know when antibiotics are needed and when they are not needed. Antibiotics do not treat viruses. You may get an antibiotic if your provider thinks that you may have, or are at risk  for, a bacterial infection and you have a viral infection. Do not ask for an antibiotic prescription if you have been diagnosed with a viral illness. Antibiotics will not make your illness go away faster. Taking antibiotics when they are not needed can lead to antibiotic resistance. When this develops, the medicine no longer works against the bacteria that it normally fights. General instructions Drink enough fluids to keep your pee (urine) pale yellow. Rest as much as possible. Return to your normal activities as told by your provider. Ask your provider what activities are safe for you. How is this prevented? To lower your risk of getting another viral illness: Wash your hands often with soap and water for at least 20 seconds. If soap and water are not available, use hand sanitizer. Avoid touching your nose, eyes, and mouth, especially if you have not washed your hands recently. If anyone in your household has a viral infection, clean all household surfaces that may have been in contact with the virus. Use soap and hot water. You may also use a commercially prepared, bleach-containing solution. Stay away from people who are sick with symptoms of a viral infection. Do not share items such as toothbrushes and water bottles with other people. Keep your vaccinations up to date. This includes getting a yearly flu shot. Eat a healthy diet and get plenty of rest. Contact a health care provider if: You have symptoms of a viral illness that do not go away. Your symptoms come back after going away. Your symptoms get worse. Get help right away if: You have trouble breathing. You have a severe headache or a stiff neck. You have severe vomiting or pain in your abdomen. These symptoms may be an emergency. Get help right away. Call 911. Do not wait to see if the symptoms will go away. Do not drive yourself to the hospital. This information is not intended to replace advice given to you by your health  care provider. Make sure you discuss any questions you have with your health care provider. Document Revised: 10/03/2022 Document Reviewed: 07/18/2022 Elsevier Patient Education  2024 [employer].

## 2024-01-30 NOTE — Progress Notes (Signed)
 Vipul Mccleaf 73 y.o.   Chief Complaint  Patient presents with   Abdominal Pain    Patient states it started around mid day Monday while he was in [place]. Today the headache is better but the stomach ache is still present but not too bad. He states no fever, did take a covid test and it was negative.     HISTORY OF PRESENT ILLNESS: This is a 73 y.o. male felt sick last Monday while he was in [place] with family.  Developed abdominal discomfort with watery nonbloody diarrhea along with headache. COVID test was negative.  No fever.  No nausea or vomiting.  No new medications.  No alcohol intake.  No chemical exposure.  No other associated symptoms. Feeling better today.  Abdominal Pain Associated symptoms include headaches. Pertinent negatives include no diarrhea, dysuria, fever, hematuria, melena, nausea or vomiting.     Prior to Admission medications   Medication Sig Start Date End Date Taking? Authorizing Provider  Cetirizine HCl (ZYRTEC ALLERGY) 10 MG CAPS at bedtime.   Yes [provider]  cholecalciferol (VITAMIN D) 1000 UNITS tablet Take 1,000 Units by mouth daily.   Yes [provider]  EPINEPHrine 0.3 mg/0.3 mL IJ SOAJ injection Inject 0.3 mg into the muscle as needed for anaphylaxis. 04/20/21  Yes [provider]  levothyroxine  (SYNTHROID ) 50 MCG tablet TAKE 1 TABLET BY MOUTH DAILY  BEFORE BREAKFAST 08/25/23  Yes Marzell Isakson, Isidro Margo, MD  rivaroxaban  (XARELTO ) 20 MG TABS tablet TAKE 1 TABLET BY MOUTH DAILY  WITH SUPPER 08/20/23  Yes Hochrein, Royston Cornea, MD  vitamin B-12 (CYANOCOBALAMIN) 100 MCG tablet Take 100 mcg by mouth daily.   Yes [provider]  vitamin C (ASCORBIC ACID) 500 MG tablet Take 500 mg by mouth daily. Two tabs   Yes [provider]  vitamin E 400 UNIT capsule Take 400 Units by mouth daily.   Yes [provider]  cetirizine (ZYRTEC) 10 MG tablet Take 10 mg by mouth at bedtime.    [provider]    Allergies  Allergen Reactions   Shellfish Allergy Hives    Patient Active Problem List   Diagnosis Date Noted   Mitral valve prolapse 09/18/2022   Atrial fibrillation (HCC) 08/14/2021   Aneurysm of ascending aorta without rupture (HCC) 08/02/2021   Palpitations 08/02/2021   Hypothyroidism 03/15/2016   Abnormal heart sounds 07/15/2012   Allergy history, peanuts 06/30/2012    Past Medical History:  Diagnosis Date   Allergy    Hypothyroid     Past Surgical History:  Procedure Laterality Date   CARDIOVERSION N/A 10/24/2021   Procedure: CARDIOVERSION;  Surgeon: Jerryl Morin, DO;  Location: [facility];  Service: Cardiovascular;  Laterality: N/A;   KNEE SURGERY     x 4   TONSILLECTOMY AND ADENOIDECTOMY     Vocal cord polyps      Social History   Socioeconomic History   Marital status: Married    Spouse name: Not on file   Number of children: 2   Years of education: Not on file   Highest education level: Bachelor's degree (e.g., BA, AB, BS)  Occupational History    Employer: [employer]  Tobacco Use   Smoking status: Never   Smokeless tobacco: Never  Substance and Sexual Activity   Alcohol use: Yes    Alcohol/week: 3.0 standard drinks of alcohol    Types: 3 Glasses of wine per week   Drug use: No   Sexual activity: Yes  Other Topics  Concern   Not on file  Social History Narrative   Lives with wife and works at [employer].  Tsp  granddaughters.       Social Drivers of Corporate investment banker Strain: Low Risk  (12/19/2023)   Overall Financial Resource Strain (CARDIA)    Difficulty of Paying Living Expenses: Not hard at all  Food Insecurity: No Food Insecurity (12/19/2023)   Hunger Vital Sign    Worried About Running Out of Food in the Last Year: Never true    Ran Out of Food in the Last Year: Never true  Transportation Needs: No Transportation Needs (12/19/2023)   PRAPARE - Administrator, Civil Service (Medical): No    Lack of Transportation  (Non-Medical): No  Physical Activity: Sufficiently Active (12/19/2023)   Exercise Vital Sign    Days of Exercise per Week: 5 days    Minutes of Exercise per Session: 40 min  Stress: No Stress Concern Present (12/19/2023)   [employer] of Occupational Health - Occupational Stress Questionnaire    Feeling of Stress : Not at all  Social Connections: Socially Integrated (12/19/2023)   Social Connection and Isolation Panel [NHANES]    Frequency of Communication with Friends and Family: More than three times a week    Frequency of Social Gatherings with Friends and Family: Twice a week    Attends Religious Services: More than 4 times per year    Active Member of [employer] or Organizations: Yes    Attends Engineer, structural: More than 4 times per year    Marital Status: Married  Catering manager Violence: Not on file    Family History  Problem Relation Age of Onset   Cancer Mother        COLON CANCER   Leukemia Father    Other Father        blood disorder   Cancer Brother        testicular cancer   Cancer Brother        liver/kidney cancer     Review of Systems  Constitutional: Negative.  Negative for chills and fever.  HENT: Negative.  Negative for congestion and sore throat.   Respiratory: Negative.  Negative for cough and shortness of breath.   Cardiovascular: Negative.  Negative for chest pain and palpitations.  Gastrointestinal:  Positive for abdominal pain. Negative for blood in stool, diarrhea, melena, nausea and vomiting.  Genitourinary: Negative.  Negative for dysuria and hematuria.  Skin: Negative.  Negative for rash.  Neurological:  Positive for headaches.  All other systems reviewed and are negative.   Vitals:   01/30/24 0940  BP: 128/68  Pulse: 72  Temp: 98.2 F (36.8 C)  SpO2: 97%    Physical Exam Constitutional:      Appearance: He is well-developed.  HENT:     Head: Normocephalic.     Mouth/Throat:     Mouth: Mucous membranes are moist.      Pharynx: Oropharynx is clear.  Eyes:     Extraocular Movements: Extraocular movements intact.     Pupils: Pupils are equal, round, and reactive to light.  Cardiovascular:     Rate and Rhythm: Normal rate. Rhythm irregular.     Pulses: Normal pulses.     Heart sounds: Normal heart sounds.  Pulmonary:     Effort: Pulmonary effort is normal.     Breath sounds: Normal breath sounds.  Abdominal:     Palpations: Abdomen is soft.  Tenderness: There is no abdominal tenderness.  Musculoskeletal:     Cervical back: No tenderness.  Lymphadenopathy:     Cervical: No cervical adenopathy.  Skin:    General: Skin is warm and dry.     Capillary Refill: Capillary refill takes less than 2 seconds.  Neurological:     General: No focal deficit present.     Mental Status: He is alert and oriented to person, place, and time.  Psychiatric:        Mood and Affect: Mood normal.        Behavior: Behavior normal.      ASSESSMENT & PLAN: Problem List Items Addressed This Visit       Cardiovascular and Mediastinum   Atrial fibrillation (HCC)   Long-term persistent.  Well-controlled ventricular rate Continues Xarelto  20 mg daily.  No bleeding episodes or complications Fall precautions given        Endocrine   Hypothyroidism   Clinically euthyroid. Continue Synthroid  50 mcg daily. Lab Results  Component Value Date   TSH 2.950 01/03/2024           Other   Viral illness - Primary   Clinically stable with no red flag signs or symptoms Differential diagnosis discussed It appears condition is running its course without complications Advised to rest and stay well-hydrated Diet and nutrition discussed Advised to monitor symptoms and contact the office if clinical picture changes or gets worse.      Patient Instructions  Viral Illness, Adult Viruses are tiny germs that can get into a person's body and cause illness. There are many different types of viruses. And they cause many  types of illness. Viral illnesses can range from mild to severe. They can affect various parts of the body. Short-term conditions that are caused by a virus include colds and flu (influenza) and stomach viruses. Long-term conditions that are caused by a virus include herpes, shingles, and human immunodeficiency virus (HIV) infection. A few viruses have been linked to certain cancers. What are the causes? Many types of viruses can cause illness. Viruses get into cells in your body, multiply, and cause the infected cells to work differently or die. When these cells die, they release more of the virus. When this happens, you get symptoms of the illness and the virus spreads to other cells. If the virus takes over how the cell works, it can cause the cell to divide and grow out of control. This happens when a virus causes cancer. Different viruses get into the body in different ways. You can get a virus by: Swallowing food or water that has come in contact with the virus. Breathing in droplets that have been coughed or sneezed into the air by an infected person. Touching a surface that has the virus on it and then touching your eyes, nose, or mouth. Being bitten by an insect or animal that carries the virus. Having sexual contact with a person who is infected with the virus. Being exposed to blood or fluids that contain the virus, either through an open cut or during a transfusion. If a virus enters your body, your body's disease-fighting system (immune system) will try to fight the virus. You may be at higher risk for a viral illness if your immune system is weak. What are the signs or symptoms? Symptoms depend on the type of virus and the location of the cells that it gets into. Symptoms can include: For cold and flu viruses: Fever. Headache. Sore throat. Muscle  aches. Stuffy nose (nasal congestion). Cough. For stomach (gastrointestinal) viruses: Fever. Pain in the abdomen. Nausea or  vomiting. Diarrhea. For liver viruses (hepatitis): Loss of appetite. Feeling tired. Skin or the white parts of your eyes turning yellow (jaundice). For brain and spinal cord viruses: Fever. Headache. Stiff neck. Nausea and vomiting. Confusion or being sleepy. For skin viruses: Warts. Itching. Rash. For sexually transmitted viruses: Discharge. Swelling. Redness. Rash. How is this diagnosed? This condition may be diagnosed based on one or more of these: Your symptoms and medical history. A physical exam. Tests, such as: Blood tests. Tests on a sample of mucus from your lungs (sputum sample). Tests on a poop (stool) sample. Tests on a swab of body fluids or a skin sore (lesion). How is this treated? Viruses can be hard to treat because they live within cells. Antibiotics do not treat viruses because these medicines do not get inside cells. Treatment for a viral illness may include: Resting and drinking a lot of fluids. Medicines to treat symptoms. These can include over-the-counter medicine for pain and fever, medicines for cough or congestion, and medicines for diarrhea. Antiviral medicines. These medicines are available only for certain types of viruses. Some viral illnesses can be prevented with vaccinations. A common example is the flu shot. Follow these instructions at home: Medicines Take over-the-counter and prescription medicines only as told by your health care provider. If you were prescribed an antiviral medicine, take it as told by your provider. Do not stop taking the antiviral even if you start to feel better. Know when antibiotics are needed and when they are not needed. Antibiotics do not treat viruses. You may get an antibiotic if your provider thinks that you may have, or are at risk for, a bacterial infection and you have a viral infection. Do not ask for an antibiotic prescription if you have been diagnosed with a viral illness. Antibiotics will not make your  illness go away faster. Taking antibiotics when they are not needed can lead to antibiotic resistance. When this develops, the medicine no longer works against the bacteria that it normally fights. General instructions Drink enough fluids to keep your pee (urine) pale yellow. Rest as much as possible. Return to your normal activities as told by your provider. Ask your provider what activities are safe for you. How is this prevented? To lower your risk of getting another viral illness: Wash your hands often with soap and water for at least 20 seconds. If soap and water are not available, use hand sanitizer. Avoid touching your nose, eyes, and mouth, especially if you have not washed your hands recently. If anyone in your household has a viral infection, clean all household surfaces that may have been in contact with the virus. Use soap and hot water. You may also use a commercially prepared, bleach-containing solution. Stay away from people who are sick with symptoms of a viral infection. Do not share items such as toothbrushes and water bottles with other people. Keep your vaccinations up to date. This includes getting a yearly flu shot. Eat a healthy diet and get plenty of rest. Contact a health care provider if: You have symptoms of a viral illness that do not go away. Your symptoms come back after going away. Your symptoms get worse. Get help right away if: You have trouble breathing. You have a severe headache or a stiff neck. You have severe vomiting or pain in your abdomen. These symptoms may be an emergency. Get help right away.  Call 911. Do not wait to see if the symptoms will go away. Do not drive yourself to the hospital. This information is not intended to replace advice given to you by your health care provider. Make sure you discuss any questions you have with your health care provider. Document Revised: 10/03/2022 Document Reviewed: 07/18/2022 Elsevier Patient Education   2024 Elsevier Inc.     Maryagnes Small, MD [facility]

## 2024-01-30 NOTE — Assessment & Plan Note (Signed)
Long-term persistent.  Well-controlled ventricular rate Continues Xarelto 20 mg daily.  No bleeding episodes or complications Fall precautions given

## 2024-01-30 NOTE — Assessment & Plan Note (Signed)
 Clinically stable with no red flag signs or symptoms Differential diagnosis discussed It appears condition is running its course without complications Advised to rest and stay well-hydrated Diet and nutrition discussed Advised to monitor symptoms and contact the office if clinical picture changes or gets worse.

## 2024-02-14 DIAGNOSIS — M79631 Pain in right forearm: Secondary | ICD-10-CM | POA: Diagnosis not present

## 2024-02-16 ENCOUNTER — Other Ambulatory Visit: Payer: Self-pay | Admitting: Cardiology

## 2024-02-16 DIAGNOSIS — I4811 Longstanding persistent atrial fibrillation: Secondary | ICD-10-CM

## 2024-02-20 ENCOUNTER — Encounter: Payer: Self-pay | Admitting: Emergency Medicine

## 2024-02-20 ENCOUNTER — Ambulatory Visit (INDEPENDENT_AMBULATORY_CARE_PROVIDER_SITE_OTHER): Admitting: Emergency Medicine

## 2024-02-20 VITALS — BP 108/78 | HR 73 | Temp 98.3°F | Ht 73.0 in | Wt 177.0 lb

## 2024-02-20 DIAGNOSIS — R6889 Other general symptoms and signs: Secondary | ICD-10-CM | POA: Diagnosis not present

## 2024-02-20 DIAGNOSIS — J22 Unspecified acute lower respiratory infection: Secondary | ICD-10-CM | POA: Insufficient documentation

## 2024-02-20 DIAGNOSIS — R051 Acute cough: Secondary | ICD-10-CM | POA: Diagnosis not present

## 2024-02-20 MED ORDER — AZITHROMYCIN 250 MG PO TABS: ORAL_TABLET | ORAL | 0 refills | Status: DC

## 2024-02-20 NOTE — Assessment & Plan Note (Signed)
 Cough management discussed Recommend over-the-counter Mucinex DM and cough drops Advised to rest and stay well-hydrated

## 2024-02-20 NOTE — Assessment & Plan Note (Signed)
 Symptom management discussed May take Tylenol as needed.  Avoid NSAIDs due to Xarelto  Advised to rest and stay well-hydrated

## 2024-02-20 NOTE — Progress Notes (Signed)
 Ethan Lee 73 y.o.   Chief Complaint  Patient presents with   Cough    Patient here for cough, congestion, body ache, and fatigue. Patient states taking mucinex helps a little. Started last Saturday he did test himself for covid last Monday and it was negative     HISTORY OF PRESENT ILLNESS: This is a 73 y.o. male complaining of flulike symptoms that started last Saturday 2 days after returning from [place] Gradual onset of symptoms with cough, congestion, body aches and fatigue.  Tested negative for COVID. Still having productive cough with green phlegm. No other associated symptoms No other complaints or medical concerns today.  Cough Pertinent negatives include no chest pain, chills, fever, headaches, rash, sore throat or shortness of breath.     Prior to Admission medications   Medication Sig Start Date End Date Taking? Authorizing Provider  Cetirizine HCl (ZYRTEC ALLERGY) 10 MG CAPS at bedtime.   Yes [provider]  cholecalciferol (VITAMIN D) 1000 UNITS tablet Take 1,000 Units by mouth daily.   Yes [provider]  EPINEPHrine 0.3 mg/0.3 mL IJ SOAJ injection Inject 0.3 mg into the muscle as needed for anaphylaxis. 04/20/21  Yes [provider]  levothyroxine  (SYNTHROID ) 50 MCG tablet TAKE 1 TABLET BY MOUTH DAILY  BEFORE BREAKFAST 08/25/23  Yes Liddy Deam, Isidro Margo, MD  rivaroxaban  (XARELTO ) 20 MG TABS tablet TAKE 1 TABLET BY MOUTH DAILY  WITH SUPPER 02/17/24  Yes Hochrein, Royston Cornea, MD  vitamin B-12 (CYANOCOBALAMIN) 100 MCG tablet Take 100 mcg by mouth daily.   Yes [provider]  vitamin C (ASCORBIC ACID) 500 MG tablet Take 500 mg by mouth daily. Two tabs   Yes [provider]  vitamin E 400 UNIT capsule Take 400 Units by mouth daily.   Yes [provider]    Allergies  Allergen Reactions   Shellfish Allergy Hives    Patient Active Problem List   Diagnosis Date Noted   Viral illness 01/30/2024   Mitral valve  prolapse 09/18/2022   Atrial fibrillation (HCC) 08/14/2021   Aneurysm of ascending aorta without rupture (HCC) 08/02/2021   Palpitations 08/02/2021   Hypothyroidism 03/15/2016   Abnormal heart sounds 07/15/2012   Allergy history, peanuts 06/30/2012    Past Medical History:  Diagnosis Date   Allergy    Hypothyroid     Past Surgical History:  Procedure Laterality Date   CARDIOVERSION N/A 10/24/2021   Procedure: CARDIOVERSION;  Surgeon: Jerryl Morin, DO;  Location: [facility];  Service: Cardiovascular;  Laterality: N/A;   KNEE SURGERY     x 4   TONSILLECTOMY AND ADENOIDECTOMY     Vocal cord polyps      Social History   Socioeconomic History   Marital status: Married    Spouse name: Not on file   Number of children: 2   Years of education: Not on file   Highest education level: Bachelor's degree (e.g., BA, AB, BS)  Occupational History    Employer: [employer]  Tobacco Use   Smoking status: Never   Smokeless tobacco: Never  Substance and Sexual Activity   Alcohol use: Yes    Alcohol/week: 3.0 standard drinks of alcohol    Types: 3 Glasses of wine per week   Drug use: No   Sexual activity: Yes  Other Topics Concern   Not on file  Social History Narrative   Lives with wife and works at [employer].  Tsp  granddaughters.       Social Drivers of  Health   Financial Resource Strain: Low Risk  (12/19/2023)   Overall Financial Resource Strain (CARDIA)    Difficulty of Paying Living Expenses: Not hard at all  Food Insecurity: No Food Insecurity (12/19/2023)   Hunger Vital Sign    Worried About Running Out of Food in the Last Year: Never true    Ran Out of Food in the Last Year: Never true  Transportation Needs: No Transportation Needs (12/19/2023)   PRAPARE - Administrator, Civil Service (Medical): No    Lack of Transportation (Non-Medical): No  Physical Activity: Sufficiently Active (12/19/2023)   Exercise Vital Sign    Days of Exercise per Week: 5 days     Minutes of Exercise per Session: 40 min  Stress: No Stress Concern Present (12/19/2023)   [employer] of Occupational Health - Occupational Stress Questionnaire    Feeling of Stress : Not at all  Social Connections: Socially Integrated (12/19/2023)   Social Connection and Isolation Panel [NHANES]    Frequency of Communication with Friends and Family: More than three times a week    Frequency of Social Gatherings with Friends and Family: Twice a week    Attends Religious Services: More than 4 times per year    Active Member of [employer] or Organizations: Yes    Attends Engineer, structural: More than 4 times per year    Marital Status: Married  Catering manager Violence: Not on file    Family History  Problem Relation Age of Onset   Cancer Mother        COLON CANCER   Leukemia Father    Other Father        blood disorder   Cancer Brother        testicular cancer   Cancer Brother        liver/kidney cancer     Review of Systems  Constitutional:  Positive for malaise/fatigue. Negative for chills and fever.  HENT:  Positive for congestion. Negative for sore throat.   Respiratory:  Positive for cough. Negative for shortness of breath.   Cardiovascular: Negative.  Negative for chest pain and palpitations.  Gastrointestinal:  Negative for abdominal pain, diarrhea, nausea and vomiting.  Skin: Negative.  Negative for rash.  Neurological:  Negative for dizziness and headaches.  All other systems reviewed and are negative.   Vitals:   02/20/24 1315  BP: 108/78  Pulse: 73  Temp: 98.3 F (36.8 C)  SpO2: 98%    Physical Exam Vitals reviewed.  Constitutional:      Appearance: Normal appearance.  HENT:     Head: Normocephalic.     Right Ear: Tympanic membrane, ear canal and external ear normal.     Left Ear: Tympanic membrane, ear canal and external ear normal.     Mouth/Throat:     Mouth: Mucous membranes are moist.     Pharynx: Oropharynx is clear.  Eyes:      Extraocular Movements: Extraocular movements intact.     Conjunctiva/sclera: Conjunctivae normal.     Pupils: Pupils are equal, round, and reactive to light.  Cardiovascular:     Rate and Rhythm: Normal rate and regular rhythm.     Pulses: Normal pulses.     Heart sounds: Normal heart sounds.  Pulmonary:     Effort: Pulmonary effort is normal.     Breath sounds: Normal breath sounds.  Musculoskeletal:     Cervical back: No tenderness.  Lymphadenopathy:     Cervical: No  cervical adenopathy.  Skin:    General: Skin is warm and dry.     Capillary Refill: Capillary refill takes less than 2 seconds.  Neurological:     General: No focal deficit present.     Mental Status: He is alert and oriented to person, place, and time.  Psychiatric:        Mood and Affect: Mood normal.        Behavior: Behavior normal.      ASSESSMENT & PLAN: A total of 32 minutes was spent with the patient and counseling/coordination of care regarding preparing for this visit, review of most recent office visit notes, review of multiple chronic medical conditions and their management, review of all medications, diagnosis of lower respiratory infection and need for antibiotics, symptom management, review of most recent bloodwork results, prognosis, documentation, and need for follow up.  Problem List Items Addressed This Visit       Respiratory   Lower respiratory infection - Primary   Upper viral respiratory infection now with secondary bacterial infection Recent traveling to [place] Clinically stable.  No signs of pneumonia Recommend daily azithromycin for 5 days Symptom management discussed Advised to rest and stay well-hydrated Advised to contact the office if no better or worse during the next several days.      Relevant Medications   azithromycin (ZITHROMAX) 250 MG tablet     Other   Flu-like symptoms   Symptom management discussed May take Tylenol as needed.  Avoid NSAIDs due to Xarelto  Advised  to rest and stay well-hydrated      Acute cough   Cough management discussed Recommend over-the-counter Mucinex DM and cough drops Advised to rest and stay well-hydrated      Patient Instructions  Cough, Adult A cough helps to clear your throat and lungs. It may be a sign of an illness or another condition. A short-term (acute) cough may last 2-3 weeks. A long-term (chronic) cough may last 8 or more weeks. Many things can cause a cough. They include: Illnesses such as: An infection in your throat or lungs. Asthma or other heart or lung problems. Gastroesophageal reflux. This is when acid comes back up from your stomach. Breathing in things that bother (irritate) your lungs. Allergies. Postnasal drip. This is when mucus runs down the back of your throat. Smoking. Some medicines. Follow these instructions at home: Medicines Take over-the-counter and prescription medicines only as told by your doctor. Talk with your doctor before you take cough medicine (cough suppressants). Eating and drinking Do not drink alcohol. Do not drink caffeine. Drink enough fluid to keep your pee (urine) pale yellow. Lifestyle Stay away from cigarette smoke. Do not smoke or use any products that contain nicotine or tobacco. If you need help quitting, ask your doctor. Stay away from things that make you cough. These may include perfume, candles, cleaning products, or campfire smoke. General instructions  Watch for any changes to your cough. Tell your doctor about them. Always cover your mouth when you cough. If the air is dry in your home, use a cool mist vaporizer or humidifier. If your cough is worse at night, try using extra pillows to raise your head up higher while you sleep. Rest as needed. Contact a doctor if: You have new symptoms. Your symptoms get worse. You cough up pus. You have a fever that does not go away. Your cough does not get better after 2-3 weeks. Cough medicine does not  help, and you are not sleeping  well. You have pain that gets worse or is not helped with medicine. You are losing weight and do not know why. You have night sweats. Get help right away if: You cough up blood. You have trouble breathing. Your heart is beating very fast. These symptoms may be an emergency. Get help right away. Call 911. Do not wait to see if the symptoms will go away. Do not drive yourself to the hospital. This information is not intended to replace advice given to you by your health care provider. Make sure you discuss any questions you have with your health care provider. Document Revised: 05/18/2022 Document Reviewed: 05/18/2022 Elsevier Patient Education  2024 Elsevier Inc.     Maryagnes Small, MD [facility]

## 2024-02-20 NOTE — Patient Instructions (Signed)

## 2024-02-20 NOTE — Assessment & Plan Note (Signed)
 Upper viral respiratory infection now with secondary bacterial infection Recent traveling to [place] Clinically stable.  No signs of pneumonia Recommend daily azithromycin for 5 days Symptom management discussed Advised to rest and stay well-hydrated Advised to contact the office if no better or worse during the next several days.

## 2024-03-30 DIAGNOSIS — M79631 Pain in right forearm: Secondary | ICD-10-CM | POA: Diagnosis not present

## 2024-04-16 DIAGNOSIS — J3089 Other allergic rhinitis: Secondary | ICD-10-CM | POA: Diagnosis not present

## 2024-04-16 DIAGNOSIS — Z91013 Allergy to seafood: Secondary | ICD-10-CM | POA: Diagnosis not present

## 2024-04-16 DIAGNOSIS — J301 Allergic rhinitis due to pollen: Secondary | ICD-10-CM | POA: Diagnosis not present

## 2024-04-16 DIAGNOSIS — L509 Urticaria, unspecified: Secondary | ICD-10-CM | POA: Diagnosis not present

## 2024-04-21 ENCOUNTER — Other Ambulatory Visit: Payer: Self-pay | Admitting: Cardiology

## 2024-04-21 DIAGNOSIS — I4811 Longstanding persistent atrial fibrillation: Secondary | ICD-10-CM

## 2024-04-22 NOTE — Telephone Encounter (Signed)
 Pt last saw Dr Lavona 09/20/23, last labs 12/23/23 Creat 0.90, age 73, weight 80.3kg, CrCl 83.03, based on CrCl pt is on appropriate dosage of Xarelto  20mg  every day for afib.  Will refill rx.

## 2024-04-27 DIAGNOSIS — M25521 Pain in right elbow: Secondary | ICD-10-CM | POA: Diagnosis not present

## 2024-05-01 ENCOUNTER — Other Ambulatory Visit: Payer: Self-pay | Admitting: Emergency Medicine

## 2024-05-01 DIAGNOSIS — E063 Autoimmune thyroiditis: Secondary | ICD-10-CM

## 2024-05-06 ENCOUNTER — Encounter: Payer: Self-pay | Admitting: Gastroenterology

## 2024-05-06 ENCOUNTER — Ambulatory Visit: Admitting: Gastroenterology

## 2024-05-06 VITALS — BP 108/68 | HR 64 | Ht 73.0 in | Wt 181.1 lb

## 2024-05-06 DIAGNOSIS — Z860100 Personal history of colon polyps, unspecified: Secondary | ICD-10-CM | POA: Diagnosis not present

## 2024-05-06 DIAGNOSIS — R195 Other fecal abnormalities: Secondary | ICD-10-CM

## 2024-05-06 DIAGNOSIS — R194 Change in bowel habit: Secondary | ICD-10-CM | POA: Diagnosis not present

## 2024-05-06 NOTE — Patient Instructions (Addendum)
 Start daily metamucil.  _______________________________________________________  If your blood pressure at your visit was 140/90 or greater, please contact your primary care physician to follow up on this.  _______________________________________________________  If you are age 73 or older, your body mass index should be between 23-30. Your Body mass index is 23.89 kg/m. If this is out of the aforementioned range listed, please consider follow up with your Primary Care Provider.  If you are age 51 or younger, your body mass index should be between 19-25. Your Body mass index is 23.89 kg/m. If this is out of the aformentioned range listed, please consider follow up with your Primary Care Provider.   ________________________________________________________  The [facility] providers would like to encourage you to use MYCHART to communicate with providers for non-urgent requests or questions.  Due to long hold times on the telephone, sending your provider a message by [facility] may be a faster and more efficient way to get a response.  Please allow 48 business hours for a response.  Please remember that this is for non-urgent requests.  _______________________________________________________  [facility] is using a team-based approach to care.  Your team is made up of your doctor and two to three APPS. Our APPS (Nurse Practitioners and Physician Assistants) work with your physician to ensure care continuity for you. They are fully qualified to address your health concerns and develop a treatment plan. They communicate directly with your gastroenterologist to care for you. Seeing the Advanced Practice Practitioners on your physician's team can help you by facilitating care more promptly, often allowing for earlier appointments, access to diagnostic testing, procedures, and other specialty referrals.    It was a pleasure to see you today!  Thank you for trusting me with your  gastrointestinal care!    Scott E.Stacia, MD

## 2024-05-06 NOTE — Progress Notes (Signed)
 Discussed the use of AI scribe software for clinical note transcription with the patient, who gave verbal consent to proceed.  HPI : Ethan Lee is a 73 year old male who presents with changes in bowel habits.  Over the past six months, he has experienced a change in stool consistency, with stools becoming soft and mushy, and occasional diarrhea. Formed stools occur less frequently. He has noticed an increase in bowel movement frequency, sometimes up to four times a day, although it has mostly returned to once or twice daily. No blood in the stool or changes in stool color.  He has lost weight from his usual 190 pounds to 182 pounds over the last six months, which he attributes to increased physical activity, including walking. He recalls episodes of illness with diarrhea after returning from trips to [place] and [place], which he associates with possible food or water-related issues. He was treated with antibiotics in April prior to his trip to [place].  He has not started any new medications and reports no significant changes in his diet. He maintains a healthy diet, including salads and mushrooms, and denies consuming excessive sugary, fatty, or greasy foods.   He has a family history of colon cancer, with his mother and possibly grandmother having had the disease. His mother was diagnosed in her early sixties and passed away at 79.  His last colonoscopy was in May 2023 and 5 small adenomas were removed.  During the review of symptoms, he denies any significant weight loss, blood in stool, or dark stools.        Colonoscopy May 2023 - One 2 mm polyp in the cecum, removed with a cold biopsy forceps. Resected and retrieved.  - One 6 mm polyp at the ileocecal valve, removed with a cold snare. Resected and retrieved.  - Three 4 to 6 mm polyps in the transverse colon, removed with a cold snare. Resected and retrieved.  - The examined portion of the ileum was normal.  -  Non-bleeding internal hemorrhoids.  Surgical [P], colon, ileocecal valve polyp x1, cecal polyp x1, transverse x3, polyp (5) TUBULAR ADENOMA. NEGATIVE FOR HIGH-GRADE DYSPLASIA.   CT Angio/Chest Dec 2024 IMPRESSION: 1. Dilation of the ascending thoracic aorta measuring up to 3.9 cm, without evidence of frank aneurysm or aortic dissection. 2. Cardiomegaly, with prominent biatrial dilatation compatible with given history of atrial fibrillation. 3. No acute intrathoracic process.   Past Medical History:  Diagnosis Date   Allergy    Hypothyroid      Past Surgical History:  Procedure Laterality Date   CARDIOVERSION N/A 10/24/2021   Procedure: CARDIOVERSION;  Surgeon: Sheena Pugh, DO;  Location: [facility];  Service: Cardiovascular;  Laterality: N/A;   KNEE SURGERY     x 4   TONSILLECTOMY AND ADENOIDECTOMY     Vocal cord polyps     Family History  Problem Relation Age of Onset   Cancer Mother        COLON CANCER   Leukemia Father    Other Father        blood disorder   Cancer Brother        testicular cancer   Cancer Brother        liver/kidney cancer   Social History   Tobacco Use   Smoking status: Never   Smokeless tobacco: Never  Substance Use Topics   Alcohol use: Yes    Alcohol/week: 3.0 standard drinks of alcohol    Types: 3 Glasses of wine  per week   Drug use: No   Current Outpatient Medications  Medication Sig Dispense Refill   azithromycin  (ZITHROMAX ) 250 MG tablet Sig as indicated 6 tablet 0   Cetirizine HCl (ZYRTEC ALLERGY) 10 MG CAPS at bedtime.     cholecalciferol (VITAMIN D) 1000 UNITS tablet Take 1,000 Units by mouth daily.     EPINEPHrine 0.3 mg/0.3 mL IJ SOAJ injection Inject 0.3 mg into the muscle as needed for anaphylaxis.     levothyroxine  (SYNTHROID ) 50 MCG tablet TAKE 1 TABLET BY MOUTH DAILY  BEFORE BREAKFAST 100 tablet 2   rivaroxaban  (XARELTO ) 20 MG TABS tablet TAKE 1 TABLET BY MOUTH DAILY  WITH SUPPER 100 tablet 1   vitamin B-12  (CYANOCOBALAMIN) 100 MCG tablet Take 100 mcg by mouth daily.     vitamin C (ASCORBIC ACID) 500 MG tablet Take 500 mg by mouth daily. Two tabs     vitamin E 400 UNIT capsule Take 400 Units by mouth daily.     No current facility-administered medications for this visit.   Allergies  Allergen Reactions   Shellfish Allergy Hives     Review of Systems: All systems reviewed and negative except where noted in HPI.    No results found.  Physical Exam: BP 108/68 (BP Location: Left Arm, Patient Position: Sitting, Cuff Size: Normal)   Pulse 64   Ht 6' 1 (1.854 m)   Wt 181 lb 1 oz (82.1 kg)   BMI 23.89 kg/m  Constitutional: Pleasant,well-developed, Caucasian male in no acute distress. HEENT: Normocephalic and atraumatic. Conjunctivae are normal. No scleral icterus. Neck supple.  Cardiovascular: Normal rate, regular rhythm.  Pulmonary/chest: Effort normal and breath sounds normal. No wheezing, rales or rhonchi. Abdominal: Soft, nondistended, nontender. Bowel sounds active throughout. There are no masses palpable. No hepatomegaly. Extremities: no edema Neurological: Alert and oriented to person place and time. Skin: Skin is warm and dry. No rashes noted. Psychiatric: Normal mood and affect. Behavior is normal.  CBC    Component Value Date/Time   WBC 3.6 12/23/2023 1349   WBC 4.6 09/11/2022 0930   RBC 4.93 12/23/2023 1349   RBC 4.73 09/11/2022 0930   HGB 14.3 12/23/2023 1349   HCT 42.6 12/23/2023 1349   PLT 158 12/23/2023 1349   MCV 86 12/23/2023 1349   MCH 29.0 12/23/2023 1349   MCH 28.7 01/26/2020 0902   MCHC 33.6 12/23/2023 1349   MCHC 33.0 09/11/2022 0930   RDW 12.4 12/23/2023 1349   LYMPHSABS 1.2 12/23/2023 1349   MONOABS 0.4 09/11/2022 0930   EOSABS 0.2 12/23/2023 1349   BASOSABS 0.0 12/23/2023 1349    CMP     Component Value Date/Time   NA 143 12/23/2023 1349   K 4.3 12/23/2023 1349   CL 105 12/23/2023 1349   CO2 22 12/23/2023 1349   GLUCOSE 85 12/23/2023  1349   GLUCOSE 88 09/11/2022 0930   BUN 23 12/23/2023 1349   CREATININE 0.90 12/23/2023 1349   CREATININE 1.14 07/26/2015 0852   CALCIUM 9.3 12/23/2023 1349   PROT 6.4 12/23/2023 1349   ALBUMIN 4.3 12/23/2023 1349   AST 20 12/23/2023 1349   ALT 11 12/23/2023 1349   ALKPHOS 64 12/23/2023 1349   BILITOT 0.9 12/23/2023 1349   GFRNONAA 69 08/31/2020 0809   GFRAA 80 08/31/2020 0809       Latest Ref Rng & Units 12/23/2023    1:49 PM 03/13/2023    9:38 AM 09/11/2022    9:30 AM  CBC EXTENDED  WBC 3.4 - 10.8 x10E3/uL 3.6  4.1  4.6   RBC 4.14 - 5.80 x10E6/uL 4.93  5.03  4.73   Hemoglobin 13.0 - 17.7 g/dL 85.6  85.1  86.5   HCT 37.5 - 51.0 % 42.6  44.1  40.5   Platelets 150 - 450 x10E3/uL 158  173  217.0   NEUT# 1.4 - 7.0 x10E3/uL 1.8  2.3  2.9   Lymph# 0.7 - 3.1 x10E3/uL 1.2  1.1  1.1       ASSESSMENT AND PLAN:  73 year old male with recent change in stool consistency, characterized by poorly formed stools.  He does have formed stool sometimes.  He does not have significant diarrhea.  Suspect his change in stool consistency may be related to preceding GI illness or antibiotics.  Change in bowel habits Chronic soft stools and occasional diarrhea likely due to altered gut flora or recent travel. No serious pathology suspected. Recent colonoscopy in 2023 makes malignany extremely unlikely. - Recommend daily Metamucil for stool bulking. - Consider probiotics if no improvement after one month. - Advise dietary modifications to limit sugary, fatty, oily, and greasy foods. - Monitor stool consistency; further evaluation if significant diarrhea develops.  History of colonic polyps Patient had 5 small adenomas removed on his colonoscopy in 2023.  Current guidelines would suggest surveillance colonoscopy in 3 years.  It appears that his recommendation for surveillance colonoscopy was incorrect at 5 years.  Patient should repeat colonoscopy in May 2026 in accordance with  guidelines.  Recording duration: 13 minutes     Hatim Homann E. Stacia, MD [facility]    [place], [place], [place]

## 2024-05-25 ENCOUNTER — Encounter: Payer: Self-pay | Admitting: Cardiology

## 2024-07-06 ENCOUNTER — Encounter: Payer: Self-pay | Admitting: Cardiology

## 2024-07-06 NOTE — Telephone Encounter (Signed)
 Patient is following up regarding this. Please advise.

## 2024-07-09 ENCOUNTER — Telehealth: Admitting: Physician Assistant

## 2024-07-09 DIAGNOSIS — J069 Acute upper respiratory infection, unspecified: Secondary | ICD-10-CM | POA: Diagnosis not present

## 2024-07-09 MED ORDER — BENZONATATE 100 MG PO CAPS: 100.0000 mg | ORAL_CAPSULE | Freq: Three times a day (TID) | ORAL | 0 refills | Status: DC | PRN

## 2024-07-09 NOTE — Progress Notes (Signed)
 We are sorry you are not feeling well.  Here is how we plan to help!  Based on what you have shared with me, it looks like you may have a viral upper respiratory infection.  Upper respiratory infections are caused by a large number of viruses; however, rhinovirus is the most common cause. Giving recent travel, I would highly recommend taking an at-home COVID test as a precaution.  Symptoms vary from person to person, with common symptoms including sore throat, cough, and fatigue or lack of energy.  A low-grade fever of up to 100.4 may present, but is often uncommon.  Symptoms vary however, and are closely related to a person's age or underlying illnesses.  The most common symptoms associated with an upper respiratory infection are nasal discharge or congestion, cough, sneezing, headache and pressure in the ears and face.  These symptoms usually persist for about 3 to 10 days, but can last up to 2 weeks.  It is important to know that upper respiratory infections do not cause serious illness or complications in most cases.    Upper respiratory infections can be transmitted from person to person, with the most common method of transmission being a person's hands.  The virus is able to live on the skin and can infect other persons for up to 2 hours after direct contact.  Also, these can be transmitted when someone coughs or sneezes; thus, it is important to cover the mouth to reduce this risk.  To keep the spread of the illness at bay, good hand hygiene is very important.  This is an infection that is most likely caused by a virus. There are no specific treatments other than to help you with the symptoms until the infection runs its course.  We are sorry you are not feeling well.  Here is how we plan to help!   For nasal congestion, you may use an oral decongestants such as Mucinex D or if you have glaucoma or high blood pressure use plain Mucinex.  Saline nasal spray or nasal drops can help and can safely be  used as often as needed for congestion.  If you do not have a history of heart disease, hypertension, diabetes or thyroid  disease, prostate/bladder issues or glaucoma, you may also use Sudafed to treat nasal congestion.  It is highly recommended that you consult with a pharmacist or your primary care physician to ensure this medication is safe for you to take.     If you have a cough, you may use cough suppressants such as Delsym and Robitussin.  If you have glaucoma or high blood pressure, you can also use Coricidin HBP.   For cough I have prescribed for you A prescription cough medication called Tessalon Perles 100 mg. You may take 1-2 capsules every 8 hours as needed for cough  If you have a sore or scratchy throat, use a saltwater gargle-  to  teaspoon of salt dissolved in a 4-ounce to 8-ounce glass of warm water.  Gargle the solution for approximately 15-30 seconds and then spit.  It is important not to swallow the solution.  You can also use throat lozenges/cough drops and Chloraseptic spray to help with throat pain or discomfort.  Warm or cold liquids can also be helpful in relieving throat pain.  For headache, pain or general discomfort, you can use Ibuprofen or Tylenol as directed.   Some authorities believe that zinc sprays or the use of Echinacea may shorten the course of your symptoms.  HOME CARE Only take medications as instructed by your medical team. Be sure to drink plenty of fluids. Water is fine as well as fruit juices, sodas and electrolyte beverages. You may want to stay away from caffeine or alcohol. If you are nauseated, try taking small sips of liquids. How do you know if you are getting enough fluid? Your urine should be a pale yellow or almost colorless. Get rest. Taking a steamy shower or using a humidifier may help nasal congestion and ease sore throat pain. You can place a towel over your head and breathe in the steam from hot water coming from a faucet. Using a  saline nasal spray works much the same way. Cough drops, hard candies and sore throat lozenges may ease your cough. Avoid close contacts especially the very young and the elderly Cover your mouth if you cough or sneeze Always remember to wash your hands.   GET HELP RIGHT AWAY IF: You develop worsening fever. If your symptoms do not improve within 10 days You develop yellow or green discharge from your nose over 3 days. You have coughing fits You develop a severe head ache or visual changes. You develop shortness of breath, difficulty breathing or start having chest pain Your symptoms persist after you have completed your treatment plan  MAKE SURE YOU  Understand these instructions. Will watch your condition. Will get help right away if you are not doing well or get worse.  Your e-visit answers were reviewed by a board certified advanced clinical practitioner to complete your personal care plan. Depending upon the condition, your plan could have included both over the counter or prescription medications. Please review your pharmacy choice. If there is a problem, you may call our nursing hot line at and have the prescription routed to another pharmacy. Your safety is important to us . If you have drug allergies check your prescription carefully.   You can use MyChart to ask questions about today's visit, request a non-urgent call back, or ask for a work or school excuse for 24 hours related to this e-Visit. If it has been greater than 24 hours you will need to follow up with your provider, or enter a new e-Visit to address those concerns. You will get an e-mail in the next two days asking about your experience.  I hope that your e-visit has been valuable and will speed your recovery. Thank you for using e-visits.   I have spent 5 minutes in review of e-visit questionnaire, review and updating patient chart, medical decision making and response to patient.   Elsie Velma Lunger,  PA-C

## 2024-10-02 DIAGNOSIS — I7789 Other specified disorders of arteries and arterioles: Secondary | ICD-10-CM | POA: Insufficient documentation

## 2024-10-02 NOTE — Progress Notes (Signed)
" °  Cardiology Office Note:   Date:  10/05/2024  ID:  Ethan Lee, DOB 1951/05/22, MRN [id number] PCP: Purcell Emil Schanz, MD  [facility] Providers Cardiologist:  Lynwood Schilling, MD {  History of Present Illness:   Ethan Lee is a 74 y.o. male who presents for follow up of atrial fib.   He had cardioversion in January 2023.    He went back into atrial fib.   Echo this 08/2022 did not demonstrate any MVP and only mild MR.     Since I last saw him he has done well.  He traveled to [place] in the [employer].  The patient denies any new symptoms such as chest discomfort, neck or arm discomfort. There has been no new shortness of breath, PND or orthopnea. There have been no reported palpitations, presyncope or syncope.  He is retired.  He has some walking for exercise.  ROS: As stated in the HPI and negative for all other systems.  Studies Reviewed:    EKG:   EKG Interpretation Date/Time:  Monday October 05 2024 09:01:45 EST Ventricular Rate:  60 PR Interval:    QRS Duration:  108 QT Interval:  424 QTC Calculation: 424 R Axis:   -71  Text Interpretation: Atrial fibrillation with premature ventricular or aberrantly conducted complexes Left axis deviation Incomplete left bundle branch block Minimal voltage criteria for LVH, may be normal variant ( Cornell product ) When compared with ECG of 20-Sep-2023 08:31, No significant change since last tracing Confirmed by Schilling Rattan ([id number]) on 10/05/2024 9:06:19 AM    Risk Assessment/Calculations:    CHA2DS2-VASc Score = 1  So you know he is ready to go this indicates a 0.6% annual risk of stroke. The patient's score is based upon: CHF History: 0 HTN History: 0 Diabetes History: 0 Stroke History: 0 Vascular Disease History: 0 Age Score: 1 Gender Score: 0             Physical Exam:   VS:  BP 136/74   Pulse 60   Ht 6' 1 (1.854 m)   Wt 180 lb (81.6 kg)   SpO2 99%   BMI 23.75 kg/m    Wt Readings from Last 3  Encounters:  10/05/24 180 lb (81.6 kg)  05/06/24 181 lb 1 oz (82.1 kg)  02/20/24 177 lb (80.3 kg)     GEN: Well nourished, well developed in no acute distress NECK: No JVD; No carotid bruits CARDIAC: Irregular RR, no murmurs, rubs, gallops RESPIRATORY:  Clear to auscultation without rales, wheezing or rhonchi  ABDOMEN: Soft, non-tender, non-distended EXTREMITIES:  No edema; No deformity   ASSESSMENT AND PLAN:   ATRIAL FIB:     The patient tolerates anticoagulation.  He had good rate control and he monitors this on his Apple Watch.  He will continue continue anticoagulation after shared decision making.  He understands he has low thromboembolic risk and prefers to stay on DOAC.  MVP:   There was no evidence of prolapse and only mild MR in 2023.  No change in therapy.   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  No further imaging at this time.     Follow up with me in 18 months.   Signed, Lynwood Schilling, MD  "

## 2024-10-05 ENCOUNTER — Encounter: Payer: Self-pay | Admitting: Cardiology

## 2024-10-05 ENCOUNTER — Ambulatory Visit: Admitting: Cardiology

## 2024-10-05 VITALS — BP 136/74 | HR 60 | Ht 73.0 in | Wt 180.0 lb

## 2024-10-05 DIAGNOSIS — I482 Chronic atrial fibrillation, unspecified: Secondary | ICD-10-CM | POA: Diagnosis not present

## 2024-10-05 DIAGNOSIS — I7789 Other specified disorders of arteries and arterioles: Secondary | ICD-10-CM

## 2024-10-05 DIAGNOSIS — I341 Nonrheumatic mitral (valve) prolapse: Secondary | ICD-10-CM

## 2024-10-05 NOTE — Patient Instructions (Signed)
 Medication Instructions:  Your physician recommends that you continue on your current medications as directed. Please refer to the Current Medication list given to you today.  *If you need a refill on your cardiac medications before your next appointment, please call your pharmacy*  Lab Work: NONE If you have labs (blood work) drawn today and your tests are completely normal, you will receive your results only by: MyChart Message (if you have MyChart) OR A paper copy in the mail If you have any lab test that is abnormal or we need to change your treatment, we will call you to review the results.  Testing/Procedures: NONE  Follow-Up: At [facility], you and your health needs are our priority.  As part of our continuing mission to provide you with exceptional heart care, our providers are all part of one team.  This team includes your primary Cardiologist (physician) and Advanced Practice Providers or APPs (Physician Assistants and Nurse Practitioners) who all work together to provide you with the care you need, when you need it.  Your next appointment:   18 months  Provider:   [place], MD  We recommend signing up for the patient portal called MyChart.  Sign up information is provided on this After Visit Summary.  MyChart is used to connect with patients for Virtual Visits (Telemedicine).  Patients are able to view lab/test results, encounter notes, upcoming appointments, etc.  Non-urgent messages can be sent to your provider as well.   To learn more about what you can do with MyChart, go to [email or web address].

## 2024-10-08 ENCOUNTER — Other Ambulatory Visit: Payer: Self-pay | Admitting: Cardiology

## 2024-10-08 DIAGNOSIS — I4811 Longstanding persistent atrial fibrillation: Secondary | ICD-10-CM

## 2024-11-02 ENCOUNTER — Ambulatory Visit: Payer: Self-pay

## 2024-11-02 NOTE — Telephone Encounter (Signed)
 FYI Only or Action Required?: FYI only for provider: appointment scheduled on 11/03/24.  Patient was last seen in primary care on 02/20/2024 by Purcell Emil Schanz, MD.  Called Nurse Triage reporting Back Pain and Hip Pain.  Symptoms began several days ago.  Interventions attempted: OTC medications: Tylenol and Ice/heat application.  Symptoms are: gradually worsening.  Triage Disposition: See PCP When Office is Open (Within 3 Days)  Patient/caregiver understands and will follow disposition?: Yes              Message from Montie POUR sent at 11/02/2024  9:29 AM EST  Reason for Triage: He is having pain in his lower back on hip area from shoveling snow and ice. This happened over the last week. He wants to know what needs to be done. Pain up to a 7-8   Reason for Disposition  [1] MODERATE back pain (e.g., interferes with normal activities) AND [2] present > 3 days  Answer Assessment - Initial Assessment Questions 1. ONSET: When did the pain begin? (e.g., minutes, hours, days)     3-4 days  2. LOCATION: Where does it hurt? (upper, mid or lower back)     Lower back goes up a little bit up to ribs, left hip  3. SEVERITY: How bad is the pain?  (e.g., Scale 1-10; mild, moderate, or severe)     7-8/10, standing up from sitting or after sleeping  4. PATTERN: Is the pain constant? (e.g., yes, no; constant, intermittent)      Comes and goes.  5. RADIATION: Does the pain shoot into your legs or somewhere else?     Left hip.  6. CAUSE:  What do you think is causing the back pain?      Strain from shoveling the snow.  7. BACK OVERUSE:  Any recent lifting of heavy objects, strenuous work or exercise?     Last week shoveling/chopping ice and snow.  8. MEDICINES: What have you taken so far for the pain? (e.g., nothing, acetaminophen, NSAIDS)     Ice, heat. Tylenol   9. NEUROLOGIC SYMPTOMS: Do you have any weakness, numbness, or problems with bowel/bladder  control?     No.  10. OTHER SYMPTOMS: Do you have any other symptoms? (e.g., fever, abdomen pain, burning with urination, blood in urine)       No numbness, weakness, urinary issues/retention, incontinence of stool or urine, abdominal pain.  Protocols used: Back Pain-A-AH

## 2024-11-03 ENCOUNTER — Telehealth: Admitting: Emergency Medicine

## 2024-11-03 VITALS — Ht 73.0 in

## 2024-11-03 DIAGNOSIS — S39012A Strain of muscle, fascia and tendon of lower back, initial encounter: Secondary | ICD-10-CM | POA: Insufficient documentation

## 2024-11-03 MED ORDER — TRAMADOL HCL 50 MG PO TABS: 50.0000 mg | ORAL_TABLET | Freq: Three times a day (TID) | ORAL | 1 refills | Status: AC | PRN

## 2024-11-03 MED ORDER — CYCLOBENZAPRINE HCL 10 MG PO TABS: 10.0000 mg | ORAL_TABLET | Freq: Every day | ORAL | 0 refills | Status: AC

## 2024-11-03 NOTE — Assessment & Plan Note (Signed)
 Acute on related to recent increased physical demands Mechanical in nature Pain management discussed Patient on Xarelto .  Cannot take NSAIDs Advised to take Tylenol for mild to moderate pain and tramadol  for moderate to severe pain Continue heat pad frequently during the day Rest and stretch Flexeril  10 mg at bedtime Advised to contact the office if no better or worse during the next several days

## 2025-01-06 ENCOUNTER — Encounter: Admitting: Emergency Medicine
